# Patient Record
Sex: Female | Born: 1950
Health system: Southern US, Community
[De-identification: ages and names within clinical notes are randomized; demographics above are authoritative.]

## PROBLEM LIST (undated history)

## (undated) DIAGNOSIS — F319 Bipolar disorder, unspecified: Secondary | ICD-10-CM

## (undated) DIAGNOSIS — Z720 Tobacco use: Secondary | ICD-10-CM

## (undated) DIAGNOSIS — D649 Anemia, unspecified: Secondary | ICD-10-CM

## (undated) DIAGNOSIS — K5790 Diverticulosis of intestine, part unspecified, without perforation or abscess without bleeding: Secondary | ICD-10-CM

## (undated) DIAGNOSIS — I10 Essential (primary) hypertension: Secondary | ICD-10-CM

## (undated) HISTORY — DX: Bipolar disorder, unspecified: F31.9

## (undated) HISTORY — DX: Essential (primary) hypertension: I10

## (undated) HISTORY — DX: Diverticulosis of intestine, part unspecified, without perforation or abscess without bleeding: K57.90

## (undated) HISTORY — DX: Tobacco use: Z72.0

## (undated) HISTORY — DX: Anemia, unspecified: D64.9

---

## 1982-04-27 HISTORY — PX: TOTAL ABDOMINAL HYSTERECTOMY: SHX209

## 1997-07-26 ENCOUNTER — Encounter (HOSPITAL_COMMUNITY): Admission: RE | Admit: 1997-07-26 | Discharge: 1997-10-24 | Payer: Self-pay

## 2001-01-22 ENCOUNTER — Inpatient Hospital Stay (HOSPITAL_COMMUNITY): Admission: EM | Admit: 2001-01-22 | Discharge: 2001-01-31 | Payer: Self-pay | Admitting: *Deleted

## 2001-02-21 ENCOUNTER — Other Ambulatory Visit: Admission: RE | Admit: 2001-02-21 | Discharge: 2001-02-21 | Payer: Self-pay | Admitting: Obstetrics & Gynecology

## 2001-08-17 ENCOUNTER — Other Ambulatory Visit (HOSPITAL_COMMUNITY): Admission: RE | Admit: 2001-08-17 | Discharge: 2001-11-15 | Payer: Self-pay | Admitting: Psychiatry

## 2001-09-01 ENCOUNTER — Encounter: Admission: RE | Admit: 2001-09-01 | Discharge: 2001-09-01 | Payer: Self-pay | Admitting: *Deleted

## 2001-12-20 ENCOUNTER — Encounter: Admission: RE | Admit: 2001-12-20 | Discharge: 2001-12-20 | Payer: Self-pay | Admitting: *Deleted

## 2002-03-06 ENCOUNTER — Encounter: Admission: RE | Admit: 2002-03-06 | Discharge: 2002-03-06 | Payer: Self-pay | Admitting: *Deleted

## 2002-06-09 ENCOUNTER — Encounter: Admission: RE | Admit: 2002-06-09 | Discharge: 2002-06-09 | Payer: Self-pay | Admitting: *Deleted

## 2002-09-20 ENCOUNTER — Encounter: Admission: RE | Admit: 2002-09-20 | Discharge: 2002-09-20 | Payer: Self-pay | Admitting: *Deleted

## 2003-01-10 ENCOUNTER — Encounter: Admission: RE | Admit: 2003-01-10 | Discharge: 2003-01-10 | Payer: Self-pay | Admitting: *Deleted

## 2004-01-18 ENCOUNTER — Ambulatory Visit (HOSPITAL_COMMUNITY): Payer: Self-pay | Admitting: Psychiatry

## 2004-02-01 ENCOUNTER — Other Ambulatory Visit: Admission: RE | Admit: 2004-02-01 | Discharge: 2004-02-01 | Payer: Self-pay | Admitting: Obstetrics & Gynecology

## 2004-02-20 ENCOUNTER — Ambulatory Visit (HOSPITAL_COMMUNITY): Payer: Self-pay | Admitting: Psychiatry

## 2004-05-21 ENCOUNTER — Ambulatory Visit (HOSPITAL_COMMUNITY): Payer: Self-pay | Admitting: Psychiatry

## 2004-08-13 ENCOUNTER — Ambulatory Visit (HOSPITAL_COMMUNITY): Payer: Self-pay | Admitting: Psychiatry

## 2004-11-18 ENCOUNTER — Ambulatory Visit: Payer: Self-pay | Admitting: Gastroenterology

## 2004-11-26 ENCOUNTER — Encounter (INDEPENDENT_AMBULATORY_CARE_PROVIDER_SITE_OTHER): Payer: Self-pay | Admitting: *Deleted

## 2004-11-26 ENCOUNTER — Ambulatory Visit: Payer: Self-pay | Admitting: Gastroenterology

## 2004-12-01 ENCOUNTER — Ambulatory Visit (HOSPITAL_COMMUNITY): Payer: Self-pay | Admitting: Psychiatry

## 2004-12-30 ENCOUNTER — Ambulatory Visit: Payer: Self-pay | Admitting: Gastroenterology

## 2005-01-07 ENCOUNTER — Ambulatory Visit: Payer: Self-pay | Admitting: Gastroenterology

## 2005-01-29 ENCOUNTER — Ambulatory Visit: Payer: Self-pay | Admitting: Gastroenterology

## 2005-02-23 ENCOUNTER — Ambulatory Visit (HOSPITAL_COMMUNITY): Payer: Self-pay | Admitting: Psychiatry

## 2005-02-25 ENCOUNTER — Ambulatory Visit: Payer: Self-pay | Admitting: Gastroenterology

## 2005-03-06 ENCOUNTER — Ambulatory Visit: Payer: Self-pay | Admitting: Gastroenterology

## 2005-05-20 ENCOUNTER — Ambulatory Visit (HOSPITAL_COMMUNITY): Payer: Self-pay | Admitting: Psychiatry

## 2005-08-12 ENCOUNTER — Ambulatory Visit (HOSPITAL_COMMUNITY): Payer: Self-pay | Admitting: Psychiatry

## 2005-11-11 ENCOUNTER — Ambulatory Visit (HOSPITAL_COMMUNITY): Payer: Self-pay | Admitting: Psychiatry

## 2006-02-03 ENCOUNTER — Ambulatory Visit (HOSPITAL_COMMUNITY): Payer: Self-pay | Admitting: Psychiatry

## 2006-04-27 DIAGNOSIS — K5790 Diverticulosis of intestine, part unspecified, without perforation or abscess without bleeding: Secondary | ICD-10-CM

## 2006-04-27 HISTORY — DX: Diverticulosis of intestine, part unspecified, without perforation or abscess without bleeding: K57.90

## 2006-06-02 ENCOUNTER — Ambulatory Visit (HOSPITAL_COMMUNITY): Payer: Self-pay | Admitting: Psychiatry

## 2006-09-01 ENCOUNTER — Ambulatory Visit (HOSPITAL_COMMUNITY): Payer: Self-pay | Admitting: Psychiatry

## 2006-10-11 ENCOUNTER — Ambulatory Visit: Payer: Self-pay | Admitting: Hospitalist

## 2006-10-11 ENCOUNTER — Inpatient Hospital Stay (HOSPITAL_COMMUNITY): Admission: EM | Admit: 2006-10-11 | Discharge: 2006-10-20 | Payer: Self-pay | Admitting: Emergency Medicine

## 2006-10-12 ENCOUNTER — Encounter: Payer: Self-pay | Admitting: Internal Medicine

## 2006-10-12 ENCOUNTER — Encounter (INDEPENDENT_AMBULATORY_CARE_PROVIDER_SITE_OTHER): Payer: Self-pay | Admitting: Hospitalist

## 2006-10-14 DIAGNOSIS — K5731 Diverticulosis of large intestine without perforation or abscess with bleeding: Secondary | ICD-10-CM

## 2006-10-18 ENCOUNTER — Ambulatory Visit: Payer: Self-pay | Admitting: Internal Medicine

## 2006-10-27 ENCOUNTER — Ambulatory Visit: Payer: Self-pay | Admitting: Internal Medicine

## 2006-10-27 ENCOUNTER — Encounter (INDEPENDENT_AMBULATORY_CARE_PROVIDER_SITE_OTHER): Payer: Self-pay | Admitting: Internal Medicine

## 2006-10-27 DIAGNOSIS — E876 Hypokalemia: Secondary | ICD-10-CM | POA: Insufficient documentation

## 2006-10-27 DIAGNOSIS — D509 Iron deficiency anemia, unspecified: Secondary | ICD-10-CM | POA: Insufficient documentation

## 2006-10-27 DIAGNOSIS — M79609 Pain in unspecified limb: Secondary | ICD-10-CM

## 2006-10-27 DIAGNOSIS — Z8719 Personal history of other diseases of the digestive system: Secondary | ICD-10-CM

## 2006-10-27 DIAGNOSIS — F319 Bipolar disorder, unspecified: Secondary | ICD-10-CM

## 2006-10-27 DIAGNOSIS — I10 Essential (primary) hypertension: Secondary | ICD-10-CM | POA: Insufficient documentation

## 2006-10-27 DIAGNOSIS — Z9079 Acquired absence of other genital organ(s): Secondary | ICD-10-CM | POA: Insufficient documentation

## 2006-10-27 LAB — CONVERTED CEMR LAB
Basophils Absolute: 0 K/uL
Basophils Relative: 0 %
Eosinophils Absolute: 0 K/uL
Eosinophils Relative: 0 %
HCT: 35.7 % — ABNORMAL LOW
Hemoglobin: 11.8 g/dL — ABNORMAL LOW
Lymphocytes Relative: 10 % — ABNORMAL LOW
Lymphs Abs: 0.7 K/uL
MCHC: 33 g/dL
MCV: 86.5 fL
Monocytes Absolute: 0.7 K/uL
Monocytes Relative: 9 %
Neutro Abs: 5.8 K/uL
Neutrophils Relative %: 80 % — ABNORMAL HIGH
Platelets: 633 K/uL — ABNORMAL HIGH
RBC: 4.13 M/uL
RDW: 15 % — ABNORMAL HIGH
WBC: 7.2 10*3/microliter

## 2006-10-28 LAB — CONVERTED CEMR LAB
BUN: 6 mg/dL (ref 6–23)
CO2: 22 meq/L (ref 19–32)
Calcium: 10.1 mg/dL (ref 8.4–10.5)
Chloride: 106 meq/L (ref 96–112)
Glucose, Bld: 127 mg/dL — ABNORMAL HIGH (ref 70–99)
Sodium: 140 meq/L (ref 135–145)

## 2006-11-08 ENCOUNTER — Encounter (INDEPENDENT_AMBULATORY_CARE_PROVIDER_SITE_OTHER): Payer: Self-pay | Admitting: Internal Medicine

## 2006-11-11 ENCOUNTER — Ambulatory Visit: Payer: Self-pay | Admitting: Internal Medicine

## 2006-11-11 ENCOUNTER — Encounter (INDEPENDENT_AMBULATORY_CARE_PROVIDER_SITE_OTHER): Payer: Self-pay | Admitting: *Deleted

## 2006-11-11 LAB — CONVERTED CEMR LAB
Cholesterol: 179 mg/dL (ref 0–200)
Glucose, Bld: 95 mg/dL (ref 70–99)
HDL: 54 mg/dL (ref >39)
Hemoglobin: 11.4 g/dL — ABNORMAL LOW (ref 12.0–15.0)
MCV: 90.2 fL (ref 78.0–100.0)
Platelets: 325 10*3/uL (ref 150–400)
Sodium: 142 meq/L (ref 135–145)
VLDL: 13 mg/dL (ref 0–40)

## 2006-12-01 ENCOUNTER — Ambulatory Visit (HOSPITAL_COMMUNITY): Payer: Self-pay | Admitting: Psychiatry

## 2007-02-03 ENCOUNTER — Ambulatory Visit: Payer: Self-pay | Admitting: Internal Medicine

## 2007-02-03 ENCOUNTER — Encounter (INDEPENDENT_AMBULATORY_CARE_PROVIDER_SITE_OTHER): Payer: Self-pay | Admitting: *Deleted

## 2007-02-03 LAB — CONVERTED CEMR LAB
BUN: 11 mg/dL (ref 6–23)
CO2: 23 meq/L (ref 19–32)
Calcium: 9.2 mg/dL (ref 8.4–10.5)
Creatinine, Ser: 0.67 mg/dL (ref 0.40–1.20)
MCHC: 32.7 g/dL (ref 30.0–36.0)
Platelets: 320 10*3/uL (ref 150–400)
Potassium: 4.1 meq/L (ref 3.5–5.3)
RDW: 15.6 % — ABNORMAL HIGH (ref 11.5–14.0)
Sodium: 142 meq/L (ref 135–145)
WBC: 5.6 10*3/uL (ref 4.0–10.5)

## 2007-03-11 ENCOUNTER — Ambulatory Visit (HOSPITAL_COMMUNITY): Payer: Self-pay | Admitting: Psychiatry

## 2007-05-12 ENCOUNTER — Telehealth (INDEPENDENT_AMBULATORY_CARE_PROVIDER_SITE_OTHER): Payer: Self-pay | Admitting: *Deleted

## 2007-05-16 ENCOUNTER — Telehealth: Payer: Self-pay | Admitting: Internal Medicine

## 2007-06-06 ENCOUNTER — Encounter (INDEPENDENT_AMBULATORY_CARE_PROVIDER_SITE_OTHER): Payer: Self-pay | Admitting: *Deleted

## 2007-06-06 ENCOUNTER — Ambulatory Visit: Payer: Self-pay | Admitting: Hospitalist

## 2007-06-06 LAB — CONVERTED CEMR LAB
CO2: 24 meq/L (ref 19–32)
Calcium: 9.8 mg/dL (ref 8.4–10.5)
Chloride: 108 meq/L (ref 96–112)
MCV: 86.9 fL (ref 78.0–100.0)
Potassium: 4.2 meq/L (ref 3.5–5.3)
RBC: 4.65 M/uL (ref 3.87–5.11)
RDW: 15.9 % — ABNORMAL HIGH (ref 11.5–15.5)
Sodium: 141 meq/L (ref 135–145)

## 2007-06-08 ENCOUNTER — Ambulatory Visit (HOSPITAL_COMMUNITY): Payer: Self-pay | Admitting: Psychiatry

## 2007-07-27 ENCOUNTER — Encounter: Admission: RE | Admit: 2007-07-27 | Discharge: 2007-07-27 | Payer: Self-pay | Admitting: Urology

## 2007-07-29 ENCOUNTER — Encounter: Payer: Self-pay | Admitting: Urology

## 2007-07-29 ENCOUNTER — Ambulatory Visit (HOSPITAL_BASED_OUTPATIENT_CLINIC_OR_DEPARTMENT_OTHER): Admission: RE | Admit: 2007-07-29 | Discharge: 2007-07-29 | Payer: Self-pay | Admitting: Urology

## 2007-08-30 ENCOUNTER — Telehealth (INDEPENDENT_AMBULATORY_CARE_PROVIDER_SITE_OTHER): Payer: Self-pay | Admitting: *Deleted

## 2007-09-06 ENCOUNTER — Telehealth: Payer: Self-pay | Admitting: *Deleted

## 2007-09-07 ENCOUNTER — Ambulatory Visit (HOSPITAL_COMMUNITY): Payer: Self-pay | Admitting: Psychiatry

## 2007-10-27 ENCOUNTER — Ambulatory Visit: Payer: Self-pay | Admitting: *Deleted

## 2007-10-27 ENCOUNTER — Encounter (INDEPENDENT_AMBULATORY_CARE_PROVIDER_SITE_OTHER): Payer: Self-pay | Admitting: *Deleted

## 2007-10-27 LAB — CONVERTED CEMR LAB
Calcium: 9.9 mg/dL (ref 8.4–10.5)
Chloride: 105 meq/L (ref 96–112)
Creatinine, Ser: 0.65 mg/dL (ref 0.40–1.20)
HCT: 42.7 % (ref 36.0–46.0)
Platelets: 306 10*3/uL (ref 150–400)
Sodium: 139 meq/L (ref 135–145)
WBC: 5.8 10*3/uL (ref 4.0–10.5)

## 2007-10-31 ENCOUNTER — Ambulatory Visit (HOSPITAL_COMMUNITY): Payer: Self-pay | Admitting: Psychiatry

## 2008-02-01 ENCOUNTER — Ambulatory Visit (HOSPITAL_COMMUNITY): Payer: Self-pay | Admitting: Psychiatry

## 2008-02-28 ENCOUNTER — Ambulatory Visit: Payer: Self-pay | Admitting: Internal Medicine

## 2008-05-02 ENCOUNTER — Ambulatory Visit (HOSPITAL_COMMUNITY): Payer: Self-pay | Admitting: Psychiatry

## 2008-06-22 ENCOUNTER — Telehealth (INDEPENDENT_AMBULATORY_CARE_PROVIDER_SITE_OTHER): Payer: Self-pay | Admitting: *Deleted

## 2008-07-18 ENCOUNTER — Ambulatory Visit: Payer: Self-pay | Admitting: Internal Medicine

## 2008-07-18 ENCOUNTER — Encounter (INDEPENDENT_AMBULATORY_CARE_PROVIDER_SITE_OTHER): Payer: Self-pay | Admitting: Internal Medicine

## 2008-07-18 ENCOUNTER — Encounter (INDEPENDENT_AMBULATORY_CARE_PROVIDER_SITE_OTHER): Payer: Self-pay | Admitting: *Deleted

## 2008-07-18 LAB — CONVERTED CEMR LAB
BUN: 11 mg/dL (ref 6–23)
CO2: 23 meq/L (ref 19–32)
Calcium: 9.5 mg/dL (ref 8.4–10.5)
Chloride: 110 meq/L (ref 96–112)
GFR calc Af Amer: >60 mL/min (ref >60)
GFR calc non Af Amer: >60 mL/min (ref >60)
HCT: 39 % (ref 36.0–46.0)
Hemoglobin: 12.9 g/dL (ref 12.0–15.0)
MCHC: 33.1 g/dL (ref 30.0–36.0)
Platelets: 292 10*3/uL (ref 150–400)
Total Bilirubin: 0.4 mg/dL (ref 0.3–1.2)
Total Protein: 7.2 g/dL (ref 6.0–8.3)

## 2008-08-03 ENCOUNTER — Ambulatory Visit (HOSPITAL_COMMUNITY): Payer: Self-pay | Admitting: Psychiatry

## 2008-10-24 ENCOUNTER — Ambulatory Visit (HOSPITAL_COMMUNITY): Payer: Self-pay | Admitting: Psychiatry

## 2009-01-14 ENCOUNTER — Ambulatory Visit (HOSPITAL_COMMUNITY): Payer: Self-pay | Admitting: Psychiatry

## 2009-02-21 ENCOUNTER — Encounter (INDEPENDENT_AMBULATORY_CARE_PROVIDER_SITE_OTHER): Payer: Self-pay | Admitting: Internal Medicine

## 2009-02-21 ENCOUNTER — Encounter: Admission: RE | Admit: 2009-02-21 | Discharge: 2009-02-21 | Payer: Self-pay | Admitting: Obstetrics & Gynecology

## 2009-02-25 ENCOUNTER — Telehealth (INDEPENDENT_AMBULATORY_CARE_PROVIDER_SITE_OTHER): Payer: Self-pay | Admitting: *Deleted

## 2009-04-10 ENCOUNTER — Ambulatory Visit (HOSPITAL_COMMUNITY): Payer: Self-pay | Admitting: Psychiatry

## 2009-06-25 ENCOUNTER — Telehealth: Payer: Self-pay | Admitting: Internal Medicine

## 2009-07-03 ENCOUNTER — Ambulatory Visit (HOSPITAL_COMMUNITY): Payer: Self-pay | Admitting: Psychiatry

## 2009-07-22 ENCOUNTER — Ambulatory Visit: Payer: Self-pay | Admitting: Internal Medicine

## 2009-07-22 DIAGNOSIS — F17200 Nicotine dependence, unspecified, uncomplicated: Secondary | ICD-10-CM | POA: Insufficient documentation

## 2009-07-26 LAB — CONVERTED CEMR LAB
BUN: 11 mg/dL (ref 6–23)
Calcium: 10 mg/dL (ref 8.4–10.5)
Chloride: 103 meq/L (ref 96–112)
Ferritin: 80 ng/mL (ref 10–291)
Glucose, Bld: 90 mg/dL (ref 70–99)
MCV: 89.7 fL (ref >=78.0)
Sodium: 142 meq/L (ref 135–145)
WBC: 5.8 10*3/uL (ref 4.0–10.5)

## 2009-08-27 ENCOUNTER — Ambulatory Visit: Payer: Self-pay | Admitting: Infectious Disease

## 2009-08-28 ENCOUNTER — Ambulatory Visit (HOSPITAL_COMMUNITY): Payer: Self-pay | Admitting: Psychiatry

## 2009-08-28 LAB — CONVERTED CEMR LAB
Cholesterol: 182 mg/dL (ref 0–200)
HDL: 54 mg/dL (ref >39)

## 2009-09-25 ENCOUNTER — Encounter (INDEPENDENT_AMBULATORY_CARE_PROVIDER_SITE_OTHER): Payer: Self-pay | Admitting: Internal Medicine

## 2009-10-30 ENCOUNTER — Ambulatory Visit (HOSPITAL_COMMUNITY): Payer: Self-pay | Admitting: Psychiatry

## 2009-11-29 ENCOUNTER — Telehealth: Payer: Self-pay | Admitting: Internal Medicine

## 2010-01-07 ENCOUNTER — Ambulatory Visit: Payer: Self-pay | Admitting: Internal Medicine

## 2010-01-07 LAB — CONVERTED CEMR LAB
Calcium: 10.1 mg/dL (ref 8.4–10.5)
Creatinine, Ser: 0.76 mg/dL (ref 0.40–1.20)
Ferritin: 109 ng/mL (ref 10–291)
Glucose, Bld: 104 mg/dL — ABNORMAL HIGH (ref 70–99)
Hemoglobin: 12.8 g/dL (ref 12.0–15.0)
Iron: 35 ug/dL — ABNORMAL LOW (ref 42–145)
MCHC: 33.2 g/dL (ref 30.0–36.0)
Potassium: 3.7 meq/L (ref 3.5–5.3)
RDW: 13.4 % (ref 11.5–15.5)
Saturation Ratios: 11 % — ABNORMAL LOW (ref 20–55)
TIBC: 313 ug/dL (ref 250–470)

## 2010-01-29 ENCOUNTER — Ambulatory Visit (HOSPITAL_COMMUNITY): Payer: Self-pay | Admitting: Psychiatry

## 2010-05-18 ENCOUNTER — Encounter: Payer: Self-pay | Admitting: Obstetrics & Gynecology

## 2010-05-21 ENCOUNTER — Ambulatory Visit (HOSPITAL_COMMUNITY)
Admission: RE | Admit: 2010-05-21 | Discharge: 2010-05-21 | Payer: Self-pay | Source: Home / Self Care | Attending: Psychiatry | Admitting: Psychiatry

## 2010-05-29 NOTE — Letter (Signed)
[facility]   Imported By: Margie Billet 09/30/2009 14:55:26  _____________________________________________________________________  External Attachment:    Type:   Image     Comment:   External Document

## 2010-05-29 NOTE — Progress Notes (Signed)
Summary: Refill/gh  Phone Note Refill Request Message from:  Fax from Pharmacy on June 25, 2009 10:02 AM  Refills Requested: Medication #1:  FERROUS SULFATE 325 (65 FE) MG  TBEC Take 1 tablet by mouth two times a day   Last Refilled: 04/05/2009 Last office visit and labs was 07/18/2008.  Attempts to call pt to schedule an appointment.  No answer unable to leave a message to call for an appointment.   Method Requested: Electronic Initial call taken by: Angelina Ok RN,  June 25, 2009 10:04 AM  Follow-up for Phone Call        Rx denied because I cannot find an indication for medication (her diverticular bleed was in 2008 and she may therefore no longer require the iron replacement therapy).  In addition, Ms. Wurtz has not been seen in the clinic in over 1 year.  She will need to be seen in clinic with a CBC and iron profile/ferritin before a prescription will be written for to assure she still requires the iron therapy.  If she can not be reached we will wait for another attempt to contact us to set this up. Follow-up by: Doneen Poisson MD,  June 25, 2009 11:42 AM  Additional Follow-up for Phone Call Additional follow up Details #1::        Rx denial called/faxed to pharmacy with message to have pt call for an appointment. Additional Follow-up by: Angelina Ok RN,  June 26, 2009 12:23 PM

## 2010-05-29 NOTE — Assessment & Plan Note (Signed)
Summary: checkup [mkj]   Vital Signs:  Patient profile:   60 year old female Height:      63.25 inches Weight:      184.2 pounds BMI:     32.49 Temp:     97.9 degrees F oral Pulse rate:   96 / minute BP sitting:   144 / 92  (right arm)  Vitals Entered By: Filomena Jungling NT II (July 22, 2009 1:36 PM) CC: NEED REFILLS Is Patient Diabetic? No Pain Assessment Patient in pain? no      Nutritional Status BMI of > 30 = obese  Have you ever been in a relationship where you felt threatened, hurt or afraid?No   Does patient need assistance? Functional Status Self care Ambulation Normal   Primary Care Provider:  Rufina Falco MD  CC:  NEED REFILLS.  History of Present Illness: Pt is a 60 yo F with HTN, bipolar disorder, and h/o Fe def anemia here for follow-up.  She reports that she has been taking her medicines almost everyday but did not have them this weekend.  She needs new refills today.  She denies dizziness when on her meds.   She has a h/o anemia for which she has been taking iron for years.  SHe denies any bleeding.  She no longer has a menstrual cycle.  She denies hematochezia or melena.  She tolerates her Fe pills. She has been taking Risperdal for many years for her bipolar disorder. She denies depressed mood, manic symptoms, SI, HI.   She gets her risperdal from her psychiatrist.   She has chronic foot pain but the celebrex has been covering her. Her GERD is well controlled with protonix.  Preventive Screening-Counseling & Management  Alcohol-Tobacco     Smoking Status: current     Smoking Cessation Counseling: yes     Packs/Day: 1pck/wk     Year Started: 1975  Caffeine-Diet-Exercise     Does Patient Exercise: no  Mammogram  Procedure date:  02/21/2009  Findings:      Normal mammogram except benign calcifications (BIRADS-2), recommend follow-up in 1 yr  Problems Prior to Update: 1)  Tobacco Abuse  (ICD-305.1) 2)  Diverticulosis, Colon, With Hemorrhage   (ICD-562.12) 3)  Preventive Health Care  (ICD-V70.0) 4)  Hypokalemia  (ICD-276.8) 5)  Hysterectomy, Total, Hx of  (ICD-V45.77) 6)  Foot Pain, Chronic  (ICD-729.5) 7)  Bipolar Affective Disorder  (ICD-296.80) 8)  Hypertension  (ICD-401.9) 9)  Gastrointestinal Hemorrhage, Hx of  (ICD-V12.79) 10)  Anemia-iron Deficiency  (ICD-280.9)  Current Medications (verified): 1)  Risperdal 3 Mg  Tabs (Risperidone) .... Take 1 Tablet By Mouth At Bedtime 2)  Klor-Con M20 20 Meq  Tbcr (Potassium Chloride Crys Cr) .... Take 1 Tablet By Mouth Once A Day 3)  Protonix 40 Mg  Tbec (Pantoprazole Sodium) .... Take 1 Tablet By Mouth Once A Day 4)  Ferrous Sulfate 325 (65 Fe) Mg  Tbec (Ferrous Sulfate) .... Take 1 Tablet By Mouth Two Times A Day 5)  Amlodipine Besylate 10 Mg  Tabs (Amlodipine Besylate) .... Take 1 Tablet By Mouth Once A Day 6)  Metoprolol Tartrate 50 Mg  Tabs (Metoprolol Tartrate) .... Take 1 Tablet By Mouth Two Times A Day 7)  Celebrex 200 Mg  Caps (Celecoxib) .... Take 1 Tablet By Mouth Once A Day  Allergies (verified): No Known Drug Allergies  Past History:  Past Medical History: Last updated: 10/27/2007 Bi-polar disorder Chronic foot pain- now seeing Dr Charlsie Merles (podiatrist):  Anemia-iron deficiency Gastrointestinal hemorrhage, hx of Hypertension Tobacco abuse History of sigmoid diverticular bleed in June 2008 (S/P Colonoscopy 09/2006) Left foot arthritis:  sees a podiatrist.  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  She does not currently want to quit. Denies alcohol or drug use.  Review of Systems       The patient complains of peripheral edema.  The patient denies anorexia, fever, weight loss, vision loss, decreased hearing, chest pain, syncope, dyspnea on exertion,  prolonged cough, abdominal pain, melena, hematochezia, difficulty walking, depression, and abnormal bleeding.    Physical Exam  General:  alert, well-developed, and well-nourished.   Head:  normocephalic and atraumatic.   Eyes:  vision grossly intact, pupils equal, and pupils round.  1 mm pupils bilaterally  Mouth:  no gingival abnormalities and pharynx pink and moist.   Lungs:  normal respiratory effort, normal breath sounds, no crackles, and no wheezes.   Heart:  normal rate, regular rhythm, no murmur, no gallop, no rub, and no JVD.   Abdomen:  soft, non-tender, normal bowel sounds, no distention, no masses, no guarding, no rigidity, and no rebound tenderness.   Extremities:  no edema Neurologic:  alert & oriented X3, cranial nerves II-XII intact, strength normal in all extremities, sensation intact to light touch, and gait normal.   Psych:  Oriented X3, memory intact for recent and remote, and poor eye contact.     Impression & Recommendations:  Problem # 1:  HYPERTENSION (ICD-401.9) BP not at goal today but pt has not been taking her meds b/c she ran out.  I will check a B-met today to assess electrolytes and renal function.  I willhave her come back in one month when she has been taking her meds and see how her BP looks.  Her updated medication list for this problem includes:    Amlodipine Besylate 10 Mg Tabs (Amlodipine besylate) .Marland Kitchen... Take 1 tablet by mouth once a day    Metoprolol Tartrate 50 Mg Tabs (Metoprolol tartrate) .Marland Kitchen... Take 1 tablet by mouth two times a day  Orders: T-Basic Metabolic Panel ([id number])  Problem # 2:  ANEMIA-IRON DEFICIENCY (ICD-280.9) Pt denies any recent h/o blood loss that she is aware of.  I will recheck a CBC along with Fe studies to reassess her need for supplemental iron.  I will also request the report from her last colonoscopy and make sure she is up to date. Her updated medication list for this problem includes:    Ferrous Sulfate 325 (65 Fe)  Mg Tbec (Ferrous sulfate) .Marland Kitchen... Take 1 tablet by mouth two times a day  Orders: T-CBC No Diff ([id number]) T-Ferritin ([id number]) Augusto Gamble ([id number]) T-Iron Binding Capacity (TIBC) ([id number])  Problem # 3:  BIPOLAR AFFECTIVE DISORDER (ICD-296.80) Pt gets her risperdal from her psychiatrist.  She has been doing well regarding this problem.  Reports mood has been stable, tolerating med well, no HI/SI.  Problem # 4:  Preventive Health Care (ICD-V70.0) She reports that she is up to date on her colonoscopy and PAP (I am requesting reports from both).  She is up to date on her mammo which I entered into the system.   I will check an FLP at next visit. I am giving her her flu shot today.  Problem # 5:  TOBACCO ABUSE (ICD-305.1) Pt has smoked about 1 pack per week for 30 yrs.  She does not currently wish to quit.  I have advised her of the risks of smoking which she understands.  Complete Medication List: 1)  Risperdal 3 Mg Tabs (Risperidone) .... Take 1 tablet by mouth at bedtime 2)  Klor-con M20 20 Meq Tbcr (Potassium chloride crys cr) .... Take 1 tablet by mouth once a day 3)  Protonix 40 Mg Tbec (Pantoprazole sodium) .... Take 1 tablet by mouth once a day 4)  Ferrous Sulfate 325 (65 Fe) Mg Tbec (Ferrous sulfate) .... Take 1 tablet by mouth two times a day 5)  Amlodipine Besylate 10 Mg Tabs (Amlodipine besylate) .... Take 1 tablet by mouth once a day 6)  Metoprolol Tartrate 50 Mg Tabs (Metoprolol tartrate) .... Take 1 tablet by mouth two times a day 7)  Celebrex 200 Mg Caps (Celecoxib) .... Take 1 tablet by mouth once a day  Other Orders: Admin 1st Vaccine ([address]) Flu Vaccine 78yrs + ([address])  Patient Instructions: 1)  Please schedule a follow-up appointment in 1 month and come in fasting. 2)  You will have somes labs checked today. I will call you if there is anything in the results that needs to be addressed.  3)  Continue taking all of your other medicines as you have been.  4)   Tobacco is very bad for your health and your loved ones! You Should stop smoking!. 5)  Stop Smoking Tips: Choose a Quit date. Cut down before the Quit date. decide what you will do as a substitute when you feel the urge to smoke(gum,toothpick,exercise). 6)  Great to meet you, call if you have any problems before next visit. Prescriptions: METOPROLOL TARTRATE 50 MG  TABS (METOPROLOL TARTRATE) Take 1 tablet by mouth two times a day  #60 Tablet x 5   Entered and Authorized by:   Brooks Sailors MD   Signed by:   Brooks Sailors MD on 07/22/2009   Method used:   Electronically to        [facility]  [address] [id number] ([facility]ail)       [address]       [place], [place]  [address]       Ph: [phone] or [phone]       Fax: [phone]   RxID:   [id number] AMLODIPINE BESYLATE 10 MG  TABS (AMLODIPINE BESYLATE) Take 1 tablet by mouth once a day  #30 Tablet x 5   Entered and Authorized by:   Brooks Sailors MD   Signed by:   Brooks Sailors MD on 07/22/2009   Method used:   [facility] to        [facility]  [address] [id number]       [address]       [place], [place]  [address]       Ph: [phone] or [phone]       Fax: [phone]   RxID:   [id number]   Prevention & Chronic Care Immunizations   Influenza vaccine: Fluvax 3+  (07/22/2009)    Tetanus booster: Not documented    Pneumococcal vaccine: Not documented  Colorectal Screening   Hemoccult: Not documented    Colonoscopy: Not documented  Other Screening   Pap smear: Not documented    Mammogram: Normal mammogram except benign calcifications (BIRADS-2), recommend follow-up in 1 yr  (02/21/2009)  Reports  requested:   Last colonoscopy report requested.   Last Pap report requested.  Smoking status: current  (07/22/2009)   Smoking cessation counseling: yes  (07/22/2009)  Lipids   Total Cholesterol: 179  (11/11/2006)   LDL: 112  (11/11/2006)   LDL Direct: Not documented   HDL:  54  (11/11/2006)   Triglycerides: 66  (11/11/2006)  Hypertension   Last Blood Pressure: 144 / 92  (07/22/2009)   Serum creatinine: 0.70  (07/18/2008)   BMP action: Ordered   Serum potassium 4.0  (07/18/2008)    Hypertension flowsheet reviewed?: Yes   Progress toward BP goal: Deteriorated  Self-Management Support :   Personal Goals (by the next clinic visit) :      Personal blood pressure goal: 130/80  (07/22/2009)   Patient will work on the following items until the next clinic visit to reach self-care goals:     Medications and monitoring: take my medicines every day, bring all of my medications to every visit  (07/22/2009)     Eating: eat more vegetables, eat foods that are low in salt, eat baked foods instead of fried foods  (07/22/2009)     Activity: take the stairs instead of the elevator  (07/22/2009)    Hypertension self-management support: Education handout, Written self-care plan  (07/22/2009)   Hypertension self-care plan printed.   Hypertension education handout printed   Nursing Instructions: Give Flu vaccine today Request report of last colonoscopy Request report of last Pap    Process Orders Check Orders Results:     [facility]: ABN not required for this insurance Tests Sent for requisitioning (July 26, 2009 6:56 PM):     07/22/2009: [facility] -- T-Basic Metabolic Panel [id number] (signed)     07/22/2009: [facility] -- T-CBC No Diff [[id number]] (signed)     07/22/2009: [facility] -- T-Ferritin [[id number]] (signed)     07/22/2009: [facility] -- Augusto Gamble [[id number]] (signed)     07/22/2009: [facility] -- T-Iron Binding Capacity (TIBC) [[id number]] (signed)    Primary Care Provider:  Rufina Falco MD  CC:  NEED REFILLS.  History of Present Illness: Pt is a 60 yo F with HTN, bipolar disorder, and h/o Fe def anemia here for follow-up.  She reports  that she has been taking her medicines almost everyday but did not have them this weekend.  She needs new refills today.  She denies dizziness when on her meds.   She has a h/o anemia for which she has been taking iron for years.  SHe denies any bleeding.  She no longer has a menstrual cycle.  She denies hematochezia or melena.  She tolerates her Fe pills. She has been taking Risperdal for many years for her bipolar disorder. She denies depressed mood, manic symptoms, SI, HI.   She gets her risperdal from her psychiatrist.   She has chronic foot pain but the celebrex has been covering her. Her GERD is well controlled with protonix.   Prevention & Chronic Care Immunizations   Influenza vaccine: Fluvax 3+  (07/22/2009)    Tetanus booster: Not documented    Pneumococcal vaccine: Not documented  Colorectal Screening   Hemoccult: Not documented    Colonoscopy: Not documented  Other Screening   Pap smear: Not documented    Mammogram: Normal mammogram except benign calcifications (BIRADS-2), recommend follow-up in 1 yr  (02/21/2009)  Reports requested:   Last colonoscopy report requested.   Last Pap report requested.  Smoking  status: current  (07/22/2009)   Smoking cessation counseling: yes  (07/22/2009)  Lipids   Total Cholesterol: 179  (11/11/2006)   LDL: 112  (11/11/2006)   LDL Direct: Not documented   HDL: 54  (11/11/2006)   Triglycerides: 66  (11/11/2006)  Hypertension   Last Blood Pressure: 144 / 92  (07/22/2009)   Serum creatinine: 0.70  (07/18/2008)   BMP action: Ordered   Serum potassium 4.0  (07/18/2008)    Hypertension flowsheet reviewed?: Yes   Progress toward BP goal: Deteriorated  Self-Management Support :   Personal Goals (by the next clinic visit) :      Personal blood pressure goal: 130/80  (07/22/2009)   Patient will work on the following items until the next clinic visit to reach self-care goals:     Medications and monitoring: take my medicines every  day, bring all of my medications to every visit  (07/22/2009)     Eating: eat more vegetables, eat foods that are low in salt, eat baked foods instead of fried foods  (07/22/2009)     Activity: take the stairs instead of the elevator  (07/22/2009)    Hypertension self-management support: Education handout, Written self-care plan  (07/22/2009)   Hypertension self-care plan printed.   Hypertension education handout printed   Nursing Instructions: Give Flu vaccine today Request report of last colonoscopy Request report of last Pap  Flu Vaccine Consent Questions     Do you have a history of severe allergic reactions to this vaccine? no    Any prior history of allergic reactions to egg and/or gelatin? no    Do you have a sensitivity to the preservative Thimersol? no    Do you have a past history of Guillan-Barre Syndrome? no    Do you currently have an acute febrile illness? no    Have you ever had a severe reaction to latex? no    Vaccine information given and explained to patient? yes    Are you currently pregnant? no    Lot [id number]   Exp Date:07/2009   Manufacturer: [employer]    Site Given  Right Deltoid [employer]

## 2010-05-29 NOTE — Assessment & Plan Note (Signed)
Summary: EST-CK/FU/MEDS/CFB   Vital Signs:  Patient profile:   60 year old female Height:      63.25 inches Weight:      192.8 pounds BMI:     34.01 Temp:     97.2 degrees F oral Pulse rate:   87 / minute BP sitting:   124 / 81  (right arm)  Vitals Entered By: Filomena Jungling NT II (January 07, 2010 10:49 AM) CC: NEED REFILLS Is Patient Diabetic? No Nutritional Status BMI of > 30 = obese  Have you ever been in a relationship where you felt threatened, hurt or afraid?No   Does patient need assistance? Functional Status Self care Ambulation Normal   Primary Care Provider:  Rufina Falco MD  CC:  NEED REFILLS.  History of Present Illness: 60 year old with Past Medical History: Bi-polar disorder Chronic foot pain- now seeing Dr Charlsie Merles (podiatrist):   Anemia-iron deficiency Hypertension Tobacco abuse History of sigmoid diverticular bleed in June 2008 (S/P Colonoscopy 09/2006)  She present to follow up her BP. She has been taking her medications. She denies adversed effect from medications. She doesnt have any new complaints.   Here for med refills.    Preventive Screening-Counseling & Management  Alcohol-Tobacco     Smoking Status: current     Smoking Cessation Counseling: yes     Packs/Day: 1pck/wk     Year Started: 1975  Caffeine-Diet-Exercise     Does Patient Exercise: no  Current Medications (verified): 1)  Risperdal 3 Mg  Tabs (Risperidone) .... Take 1 Tablet By Mouth At Bedtime 2)  Protonix 40 Mg  Tbec (Pantoprazole Sodium) .... Take 1 Tablet By Mouth Once A Day 3)  Amlodipine Besylate 10 Mg  Tabs (Amlodipine Besylate) .... Take 1 Tablet By Mouth Once A Day 4)  Metoprolol Tartrate 50 Mg  Tabs (Metoprolol Tartrate) .... Take 1 Tablet By Mouth Two Times A Day 5)  Celebrex 200 Mg  Caps (Celecoxib) .... Take 1 Tablet By Mouth Once A Day  Allergies (verified): No Known Drug Allergies  Past History:  Past Medical History: Last updated: 10/27/2007 Bi-polar  disorder Chronic foot pain- now seeing Dr Charlsie Merles (podiatrist):   Anemia-iron deficiency Gastrointestinal hemorrhage, hx of Hypertension Tobacco abuse History of sigmoid diverticular bleed in June 2008 (S/P Colonoscopy 09/2006) Left foot arthritis:  sees a podiatrist.  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  She does not currently want to quit. Denies alcohol or drug use.  Risk Factors: Exercise: no (01/07/2010)  Risk Factors: Smoking Status: current (01/07/2010) Packs/Day: 1pck/wk (01/07/2010)  Review of Systems      See HPI  Physical Exam  General:  alert and well-developed.   Head:  normocephalic and atraumatic.   Eyes:  vision grossly intact, pupils equal, pupils round, and pupils reactive to light.   Mouth:  good dentition.   Neck:  supple.   Lungs:  normal respiratory effort and normal breath sounds.   Heart:  normal rate and regular rhythm.   Abdomen:  soft and non-tender.   Msk:  normal ROM.   Extremities:  trace lower extremity edema  Neurologic:  alert & oriented X3.   Psych:  Oriented X3, good eye contact, not anxious appearing, and not depressed appearing.  Impression & Recommendations:  Problem # 1:  HYPERTENSION (ICD-401.9) Assessment Unchanged Blood pressure at goal, continue with current regimen.   Her updated medication list for this problem includes:    Amlodipine Besylate 10 Mg Tabs (Amlodipine besylate) .Marland Kitchen... Take 1 tablet by mouth once a day    Metoprolol Tartrate 50 Mg Tabs (Metoprolol tartrate) .Marland Kitchen... Take 1 tablet by mouth two times a day  Orders: T-Basic Metabolic Panel [phone])  BP today: 124/81 Prior BP: 125/84 (08/27/2009)  Labs Reviewed: K+: 3.8 (07/22/2009) Creat: : 0.84 (07/22/2009)    Chol: 182 (08/27/2009)   HDL: 54 (08/27/2009)   LDL: 108 (08/27/2009)   TG: 98 (08/27/2009)  Problem # 2:  PREVENTIVE HEALTH CARE (ICD-V70.0) Obtain colonoscopy report.   Problem # 3:  BIPOLAR AFFECTIVE DISORDER (ICD-296.80) Assessment: Comment Only Managed on risperdone by [facility]. Currently stable.   Problem # 4:  ANEMIA-IRON DEFICIENCY (ICD-280.9) Assessment: Comment Only Pt no longer on iron, as her last anemia panel and Hgb were within normal limits. Will check anemia panel and CBC today.   The following medications were removed from the medication list:    Ferrous Sulfate 325 (65 Fe) Mg Tbec (Ferrous sulfate) .Marland Kitchen... Take 1 tablet by mouth two times a day  Hgb: 14.9 (07/22/2009)   Hct: 44.4 (07/22/2009)   Platelets: 317 (07/22/2009) RBC: 4.95 (07/22/2009)   RDW: 13.5 (07/22/2009)   WBC: 5.8 (07/22/2009) MCV: 89.7 (07/22/2009)   MCHC: 33.6 (07/22/2009) Ferritin: 80 (07/22/2009) Iron: 79 (07/22/2009)   TIBC: 293 (07/22/2009)   % Sat: 27 (07/22/2009)  Orders: T-CBC No Diff ([id number]) T-Ferritin ([id number]) T-Iron ([id number]) T-Iron Binding Capacity (TIBC) ([id number])  Complete Medication List: 1)  Risperdal 3 Mg Tabs (Risperidone) .... Take 1 tablet by mouth at bedtime 2)  Protonix 40 Mg Tbec (Pantoprazole sodium) .... Take 1 tablet by mouth once a day 3)  Amlodipine Besylate 10 Mg Tabs (Amlodipine besylate) .... Take 1 tablet by mouth once a day 4)  Metoprolol Tartrate 50 Mg Tabs (Metoprolol tartrate) .... Take 1 tablet by mouth two times a day 5)  Celebrex 200 Mg Caps (Celecoxib) .... Take 1 tablet by mouth once a day  Patient Instructions: 1)  Please schedule a follow-up appointment in 6 months. 2)  Tobacco is very bad for your health and your loved ones! You Should stop smoking!. 3)  Stop Smoking Tips: Choose a Quit date. Cut down before the Quit date. decide what you will do as a substitute when you feel the urge to  smoke(gum,toothpick,exercise). Prescriptions: METOPROLOL TARTRATE 50 MG  TABS (METOPROLOL TARTRATE) Take 1 tablet by mouth two times a day  #60 Tablet x 6   Entered and Authorized by:   Melida Quitter MD   Signed by:   Melida Quitter MD on 01/07/2010   Method used:   Print then Give to Patient   RxID:   [id number] AMLODIPINE BESYLATE 10 MG  TABS (AMLODIPINE BESYLATE) Take 1 tablet by mouth once a day  #30 Tablet x 6   Entered and Authorized by:   Melida Quitter MD   Signed by:   Melida Quitter MD on 01/07/2010   Method used:   Print then Give to Patient   RxID:   [id number] PROTONIX 40 MG  TBEC (PANTOPRAZOLE SODIUM) Take 1 tablet by mouth once a day  #30 Tablet x 6   Entered and Authorized by:   Melida Quitter MD   Signed by:   Melida Quitter MD on 01/07/2010   Method used:  Print then Give to Patient   RxID:   [id number]   Prevention & Chronic Care Immunizations   Influenza vaccine: Fluvax 3+  (07/22/2009)    Tetanus booster: Not documented    Pneumococcal vaccine: Not documented  Colorectal Screening   Hemoccult: Not documented    Colonoscopy: Not documented   Colonoscopy due: 12/26/2017  Other Screening   Pap smear: Not documented   Pap smear action/deferral: Not indicated S/P hysterectomy  (01/07/2010)    Mammogram: Normal mammogram except benign calcifications (BIRADS-2), recommend follow-up in 1 yr  (02/21/2009)  Reports requested:   Last colonoscopy report requested.  Smoking status: current  (01/07/2010)   Smoking cessation counseling: yes  (01/07/2010)  Lipids   Total Cholesterol: 182  (08/27/2009)   LDL: 108  (08/27/2009)   LDL Direct: Not documented   HDL: 54  (08/27/2009)   Triglycerides: 98  (08/27/2009)  Hypertension   Last Blood Pressure: 124 / 81  (01/07/2010)   Serum creatinine: 0.84  (07/22/2009)   BMP action: Ordered   Serum potassium 3.8  (07/22/2009)    Hypertension flowsheet reviewed?: Yes   Progress toward BP goal: At  goal  Self-Management Support :   Personal Goals (by the next clinic visit) :      Personal blood pressure goal: 130/80  (01/07/2010)   Patient will work on the following items until the next clinic visit to reach self-care goals:     Medications and monitoring: take my medicines every day, bring all of my medications to every visit  (01/07/2010)     Eating: drink diet soda or water instead of juice or soda, eat more vegetables, use fresh or frozen vegetables, eat foods that are low in salt, eat baked foods instead of fried foods, eat fruit for snacks and desserts  (01/07/2010)     Activity: take a 30 minute walk every day  (01/07/2010)    Hypertension self-management support: Education handout, Resources for patients handout, Written self-care plan  (01/07/2010)   Hypertension self-care plan printed.   Hypertension education handout printed      Resource handout printed.   Nursing Instructions: Request report of last colonoscopy    Process Orders Check Orders Results:     [facility]: ABN not required for this insurance Tests Sent for requisitioning (January 07, 2010 11:30 AM):     01/07/2010: [facility] -- T-Basic Metabolic Panel [phone] (signed)     01/07/2010: [facility] -- T-CBC No Diff [[id number]] (signed)     01/07/2010: [facility] -- T-Ferritin [[id number]] (signed)     01/07/2010: [facility] -- Augusto Gamble [[id number]] (signed)     01/07/2010: [facility] -- T-Iron Binding Capacity (TIBC) [[id number]] (signed)

## 2010-05-29 NOTE — Progress Notes (Signed)
Summary: Refill/gh  Phone Note Refill Request Message from:  Fax from Pharmacy on November 29, 2009 2:52 PM  Refills Requested: Medication #1:  PROTONIX 40 MG  TBEC Take 1 tablet by mouth once a day   Last Refilled: 10/29/2009  Method Requested: Electronic Initial call taken by: Angelina Ok RN,  November 29, 2009 2:52 PM  Follow-up for Phone Call        Refill approved-nurse to complete Follow-up by: Melida Quitter MD,  December 01, 2009 12:46 PM    Prescriptions: PROTONIX 40 MG  TBEC (PANTOPRAZOLE SODIUM) Take 1 tablet by mouth once a day  #30 Tablet x 6   Entered and Authorized by:   Melida Quitter MD   Signed by:   Melida Quitter MD on 12/01/2009   Method used:   Electronically to        [facility]  [address] [id number] (retail)       [address]       [place], [place]  [address]       Ph: [phone] or [phone]       Fax: [phone]   RxID:   [id number]

## 2010-05-29 NOTE — Assessment & Plan Note (Signed)
Summary: acute- 1 month f/u with bp and fastin labs per dr sitko/cfb   Vital Signs:  Patient profile:   60 year old female Height:      63.25 inches (160.66 cm) Weight:      187.6 pounds (85.27 kg) BMI:     33.09 Temp:     97.2 degrees F (36.22 degrees C) oral Pulse rate:   78 / minute BP sitting:   125 / 84  (right arm)  Vitals Entered By: Stanton Kidney Ditzler RN (Aug 27, 2009 9:56 AM) Is Patient Diabetic? No Pain Assessment Patient in pain? no      Nutritional Status BMI of > 30 = obese Nutritional Status Detail appetite good  Have you ever been in a relationship where you felt threatened, hurt or afraid?denies   Does patient need assistance? Functional Status Self care Ambulation Normal Comments FU and fasting for labs. Needs refills on med - ins co wants meds airmail.   Primary Care Provider:  Rufina Falco MD   History of Present Illness: 60 year old with Past Medical History: Bi-polar disorder Chronic foot pain- now seeing Dr Charlsie Merles (podiatrist):   Anemia-iron deficiency Gastrointestinal hemorrhage, hx of Hypertension Tobacco abuse History of sigmoid diverticular bleed in June 2008 (S/P Colonoscopy 09/2006) Left foot arthritis:  sees a podiatrist.  On celebrex  She present to follow up her BP. She has been taking her medications. She denies adversed effect from medications. She doesnt have any new complaints.  She said that her insurance company wont cover for her medication unless she get her medications by mail.  Depression History:      The patient denies a depressed mood most of the day and a diminished interest in her usual daily activities.         Preventive Screening-Counseling & Management  Alcohol-Tobacco     Smoking Status: current     Smoking Cessation Counseling: yes     Packs/Day: 1pck/wk     Year Started: 1975  Caffeine-Diet-Exercise     Does Patient Exercise: no  Current Medications (verified): 1)  Risperdal 3 Mg  Tabs (Risperidone) .... Take  1 Tablet By Mouth At Bedtime 2)  Klor-Con M20 20 Meq  Tbcr (Potassium Chloride Crys Cr) .... Take 1 Tablet By Mouth Once A Day 3)  Protonix 40 Mg  Tbec (Pantoprazole Sodium) .... Take 1 Tablet By Mouth Once A Day 4)  Ferrous Sulfate 325 (65 Fe) Mg  Tbec (Ferrous Sulfate) .... Take 1 Tablet By Mouth Two Times A Day 5)  Amlodipine Besylate 10 Mg  Tabs (Amlodipine Besylate) .... Take 1 Tablet By Mouth Once A Day 6)  Metoprolol Tartrate 50 Mg  Tabs (Metoprolol Tartrate) .... Take 1 Tablet By Mouth Two Times A Day 7)  Celebrex 200 Mg  Caps (Celecoxib) .... Take 1 Tablet By Mouth Once A Day  Allergies: No Known Drug Allergies  Review of Systems  The patient denies fever, chest pain, syncope, dyspnea on exertion, peripheral edema, prolonged cough, headaches, hemoptysis, abdominal pain, and melena.    Physical Exam  General:  alert, well-developed, and well-nourished.   Head:  normocephalic, atraumatic, and no abnormalities observed.   Lungs:  normal respiratory effort, no intercostal retractions, no accessory muscle use, and normal breath sounds.   Heart:  normal rate and regular rhythm.   Abdomen:  soft, non-tender, normal bowel sounds, and no distention.   Extremities:  No edema.  Neurologic:  alert & oriented X3.  Impression & Recommendations:  Problem # 1:  HYPERTENSION (ICD-401.9) Her blood pressure is well controlled. I will continue with current regimen. I refer her to MAP program to help her with the process of getting medications trhough mail. I advised patient to let us know if the MAPprogrem cant help her.  Her updated medication list for this problem includes:    Amlodipine Besylate 10 Mg Tabs (Amlodipine besylate) .Marland Kitchen... Take 1 tablet by mouth once a day    Metoprolol Tartrate 50 Mg Tabs (Metoprolol tartrate) .Marland Kitchen... Take 1 tablet by mouth two times a day  BP today: 125/84 Prior BP: 144/92 (07/22/2009)  Labs Reviewed: K+: 3.8 (07/22/2009) Creat: : 0.84 (07/22/2009)    Chol: 179 (11/11/2006)   HDL: 54 (11/11/2006)   LDL: 112 (11/11/2006)   TG: 66 (11/11/2006)  Problem # 2:  ANEMIA-IRON DEFICIENCY (ICD-280.9) As per Dr Theotis Barrio note, I advised patient to finish iron tablet left.  Her updated medication list for this problem includes:    Ferrous Sulfate 325 (65 Fe) Mg Tbec (Ferrous sulfate) .Marland Kitchen... Take 1 tablet by mouth two times a day  Problem # 3:  SCREENING FOR LIPOID DISORDERS (ICD-V77.91) I will check fasting lipid profile. I will treat as needed.  Orders: T-Lipid Profile ([id number])  Problem # 4:  TOBACCO ABUSE (ICD-305.1) Counseling was provied.   Complete Medication List: 1)  Risperdal 3 Mg Tabs (Risperidone) .... Take 1 tablet by mouth at bedtime 2)  Klor-con M20 20 Meq Tbcr (Potassium chloride crys cr) .... Take 1 tablet by mouth once a day 3)  Protonix 40 Mg Tbec (Pantoprazole sodium) .... Take 1 tablet by mouth once a day 4)  Ferrous Sulfate 325 (65 Fe) Mg Tbec (Ferrous sulfate) .... Take 1 tablet by mouth two times a day 5)  Amlodipine Besylate 10 Mg Tabs (Amlodipine besylate) .... Take 1 tablet by mouth once a day 6)  Metoprolol Tartrate 50 Mg Tabs (Metoprolol tartrate) .... Take 1 tablet by mouth two times a day 7)  Celebrex 200 Mg Caps (Celecoxib) .... Take 1 tablet by mouth once a day  Patient Instructions: 1)  Please schedule a follow-up appointment in 2 months.  Prevention & Chronic Care Immunizations   Influenza vaccine: Fluvax 3+  (07/22/2009)    Tetanus booster: Not documented    Pneumococcal vaccine: Not documented  Colorectal Screening   Hemoccult: Not documented    Colonoscopy: Not documented  Other Screening   Pap smear: Not documented    Mammogram: Normal mammogram except benign calcifications (BIRADS-2), recommend follow-up in 1 yr  (02/21/2009)   Smoking status: current  (08/27/2009)   Smoking cessation counseling: yes  (08/27/2009)  Lipids   Total Cholesterol: 179  (11/11/2006)   LDL: 112  (11/11/2006)    LDL Direct: Not documented   HDL: 54  (11/11/2006)   Triglycerides: 66  (11/11/2006)  Hypertension   Last Blood Pressure: 125 / 84  (08/27/2009)   Serum creatinine: 0.84  (07/22/2009)   BMP action: Ordered   Serum potassium 3.8  (07/22/2009)  Self-Management Support :   Personal Goals (by the next clinic visit) :      Personal blood pressure goal: 130/80  (07/22/2009)   Patient will work on the following items until the next clinic visit to reach self-care goals:     Medications and monitoring: take my medicines every day, bring all of my medications to every visit  (08/27/2009)     Eating: eat more vegetables, use fresh or frozen vegetables, eat  foods that are low in salt, eat baked foods instead of fried foods, eat fruit for snacks and desserts, limit or avoid alcohol  (08/27/2009)     Activity: take a 30 minute walk every day  (08/27/2009)    Hypertension self-management support: Written self-care plan, Education handout, Resources for patients handout  (08/27/2009)   Hypertension self-care plan printed.   Hypertension education handout printed      Resource handout printed.  Process Orders Check Orders Results:     [facility]: ABN not required for this insurance Tests Sent for requisitioning (Aug 27, 2009 1:47 PM):     08/27/2009: [facility] -- T-Lipid Profile [phone] (signed)    Process Orders Check Orders Results:     [facility]: ABN not required for this insurance Tests Sent for requisitioning (Aug 27, 2009 1:47 PM):     08/27/2009: [facility] -- T-Lipid Profile [phone] (signed)

## 2010-06-26 ENCOUNTER — Other Ambulatory Visit: Payer: Self-pay | Admitting: *Deleted

## 2010-06-27 MED ORDER — AMLODIPINE BESYLATE 10 MG PO TABS: 10.0000 mg | ORAL_TABLET | Freq: Every day | ORAL | Status: DC

## 2010-06-27 MED ORDER — PANTOPRAZOLE SODIUM 40 MG PO TBEC: 40.0000 mg | DELAYED_RELEASE_TABLET | Freq: Every day | ORAL | Status: DC

## 2010-07-02 ENCOUNTER — Telehealth: Payer: Self-pay | Admitting: *Deleted

## 2010-07-02 NOTE — Telephone Encounter (Signed)
Call from [facility] pt would like to get Metoprolol Tartrate 50 mg tablets # 90 with 1 years refill sent to [facility] .

## 2010-07-03 ENCOUNTER — Other Ambulatory Visit: Payer: Self-pay | Admitting: *Deleted

## 2010-07-03 MED ORDER — METOPROLOL TARTRATE 50 MG PO TABS: 50.0000 mg | ORAL_TABLET | Freq: Two times a day (BID) | ORAL | Status: DC

## 2010-07-04 NOTE — Telephone Encounter (Signed)
Refill called to [facility] per order of Dr. Baltazar Apo.

## 2010-07-24 ENCOUNTER — Other Ambulatory Visit: Payer: Self-pay | Admitting: Internal Medicine

## 2010-07-30 ENCOUNTER — Encounter (HOSPITAL_COMMUNITY): Payer: BC Managed Care – PPO | Admitting: Psychiatry

## 2010-07-30 DIAGNOSIS — F3189 Other bipolar disorder: Secondary | ICD-10-CM

## 2010-09-09 NOTE — Op Note (Signed)
[name], Pamela Hardin             ACCOUNT NO.:  [id number]   MEDICAL RECORD NO.:  [id number]          PATIENT TYPE:  AMB   LOCATION:  [facility]                         FACILITY:  [facility]   PHYSICIAN:  Bertram Millard. Dahlstedt, M.D.DATE OF BIRTH:  November 29, 1950   DATE OF PROCEDURE:  07/29/2007  DATE OF DISCHARGE:                               OPERATIVE REPORT   PREOPERATIVE DIAGNOSIS:  Bladder lesion.   POSTOPERATIVE DIAGNOSIS:  Bladder lesion with abnormal ureteral  orifices.   PRINCIPAL PROCEDURE:  Cystoscopy, bladder biopsy.   SURGEON:  Bertram Millard. Dahlstedt, M.D.   ANESTHESIA:  General with LMA.   COMPLICATIONS:  None.   BRIEF HISTORY:  This lady was referred quite a few months ago for  microscopic hematuria.  This has been found in the office.  CT revealed  essentially normal findings.  Cystoscopy revealed a lesion on the right  side of the bladder.  It was recommended she undergo cysto with biopsy.  I explained the risks and complications of the procedure.  The patient  appears to understand these and desires to proceed.   DESCRIPTION OF PROCEDURE:  The patient was identified in the holding  area and received preoperative IV antibiotics.  She was taken to the  operating room where general anesthetic was administered using the LMA.  She was placed in the dorsal lithotomy position and genitalia and  perineum were prepped and draped.  A time-out was then called.  Proper  patient, proper procedure, proper positioning, proper position,  antibiotics were discussed.  The procedure then commenced.  Cystoscope  was placed in her bladder.  There was a 5-6 mm erythematous lesion on  the right bladder wall.  I saw no other bladder lesions/tumors.  No  bladder calculi were noted.  She did have quite patulous ureteral  orifices with extremely lateral position of these - they were quite  golf hole in appearance.  I saw no lesions around the ureteral  orifice.  After the biopsy was performed of this  bladder wall lesion the  biopsy base was then cauterized with a Bugbee electrode.  The bladder  was drained.  The specimen was sent as bladder biopsy.  She tolerated the procedure well.  She was awakened and taken to the  PACU in stable condition.  She will follow up with me on April 14 of  this month.  She was discharged on urell #15 one p.o. q.6 h. p.r.n.  bladder pain and Bactrim DS one p.o. b.i.d. for 3 days.      Bertram Millard. Dahlstedt, M.D.  Electronically Signed     SMD/MEDQ  D:  07/29/2007  T:  07/29/2007  Job:  [id number]   cc:   Gerrit Friends. Aldona Bar, M.D.  Fax: [phone]

## 2010-09-09 NOTE — Discharge Summary (Signed)
NAMERAYLEN, KEN NO.:  [id number]   MEDICAL RECORD NO.:  [id number]          PATIENT TYPE:  INP   LOCATION:  5154                         FACILITY:  [facility]   PHYSICIAN:  Eliseo Gum, M.D.   DATE OF BIRTH:  1951-03-31   DATE OF ADMISSION:  10/11/2006  DATE OF DISCHARGE:  10/20/2006                               DISCHARGE SUMMARY   DISCHARGE DIAGNOSES:  1. Anemia likely secondary to acute diverticular bleed, most notably      in the sigmoid colon.  2. Orthostatic hypotension.  3. Hypertension.  4. Bipolar disorder.   DISCHARGE MEDICATIONS:  1. Risperdal 3 mg p.o. q.h.s.  2. Klor-Con 20 mEq p.o. daily.  3. Protonix 40 mg p.o. daily.  4. Ferrous sulfate 325 mg p.o. b.i.d.  Will hold amlodipine and metoprolol secondary to the patient's  hypotension at the time of discharge.   DISPOSITION:  The patient is follow with Dr. Noel Gerold in the outpatient  clinic on October 27, 2006 at 9:45 a.m.  The patient should have a CBC  checked as well as her blood pressure checked to make sure she is no  longer hypotensive.   PROCEDURES PERFORMED:  1. The patient had acute abdominal series done on October 11, 2006, which      showed nonobstructive bowel gas pattern, no acute cardiopulmonary      disease.  2. The patient had CT of abdomen with contrast on October 11, 2006.      Impression:  1.  Nonspecific thickening of the left adrenal gland.      2.  Bibasilar atelectasis.  3.  Duplicated IVC and normal variant.      4.  Colonic diverticulosis.  3. The patient also had a CT of the pelvis done on October 11, 2006, that      showed bladder distension.  4. The patient also had a nuclear medicine bleeding scan done on October 13, 2006.  Impression:  Bleeding likely originates in the sigmoid      colon.  5. The patient also had a diagnostic barium enema on October 15, 2006.      Impression:  Colonic diverticular disease.  No evidence of bowel      obstruction.  Note that this was an  unprepped barium enema and      therefore polypoid lesions cannot be excluded with certainty.   CONSULTATIONS:  Hedwig Morton. Juanda Chance, M.D. with gastroenterology.   CHIEF COMPLAINT:  Diarrhea and blood in the stool.   The patient's primary care physician is urgent care.   HISTORY OF PRESENT ILLNESS:  Ms. Prindiville is a 60 year old African  American woman with a history of hypertension, tobacco abuse, and a  history of bipolar disorder presenting with a 1-day history of diarrhea  and dark blood in her stool.  The patient reports the diarrhea started  the morning prior to admission.  It was watery and had dark blood mixed  with stool from the beginning.  The patient reports that she went to  church despite the diarrhea hoping that she would feel better but  she  continued to have stools, approximately 3-4 per hour.  The patient  reports that she thinks she lost approximately 3 cups of blood total.  She has no prior episodes of blood in her stools.  She does admit to  taking 6 Aleve a day for many years for chronic leg pain.  She had a  colonoscopy 2 years ago at [facility] which was negative but a polyp was  taken out at that time.  She denies any nausea or vomiting, weight loss,  night sweats.  She had a UTI 2 weeks ago and was treated with  antibiotics.  She does not know which one.   ALLERGIES:  NO KNOWN DRUG ALLERGIES.   PAST MEDICAL HISTORY:  1. Hypertension.  2. Tobacco abuse.  3. Bipolar disorder for whom she sees [facility].  4. Anemia with heme-positive stool cards in 2006.  5. Chronic pain.  6. Total hysterectomy in 1983.   HOME MEDICATIONS:  Metoprolol, Klor-Con, amlodipine, Risperdal, and  Aleve.   SUBSTANCE HISTORY:  The patient is a current smoker up to a pack a day  times greater than 20 years.  She denies alcohol, cocaine, IV drugs.   SOCIAL HISTORY:  The patient is single.  She lives alone.   FAMILY HISTORY:  Mother has hypertension and pacemaker.   Father passed  away at 63, in his sleep.  He had diabetes.  She has one sister with  cancer, another who passed away with an MI at the age of 23.   REVIEW OF SYSTEMS:  As noted in the HPI.   PHYSICAL EXAMINATION:  VITAL SIGNS:  Temperature equals 99, blood  pressure equals 101/61 with repeat of 81-46 and then 121/70.  The  patient had a pulse of 108, respiratory rate 20, O2 sat 100% on 2  liters.  GENERAL:  The patient is in no acute distress, pleasant.  She has stains  of blood on the bed sheet.  EYES:  PERLA, EOMI, anicteric.  ENT:  Clear, poor dentition, the patient is wearing dentures.  NECK:  Supple.  No thyromegaly.  RESPIRATORY:  CTA bilaterally.  CV:  Tachycardic.  Regular.  No murmurs, rubs or gallops.  GI:  Soft, nontender, nondistended, bowel sounds were hypoactive.  EXTREMITIES:  No edema.  GU:  No CVA tenderness.  SKIN:  No rashes, dry skin.  MUSCULOSKELETAL:  The patient is moving all fours.  NEURO:  The patient is alert and oriented x3.  Cranial nerves grossly  intact.  Cerebellar intact heel-to-shin and finger-to-nose.  Motor is  5/5 in all four extremities.  Sensation is intact.  PSYCHIATRIC:  Appropriate.   INITIAL LABORATORY:  Sodium 138, potassium 3.7, chloride 114, bicarb 19,  with a gap of 8, BUN 14, creatinine 0.53, glucose 129.  Bilirubin 0.2,  alk phos 42, AST 14, ALT 11, protein 5, albumin 2.8, calcium 7.8.  The  patient is a FOBT positive.  PT 15.2, PTT 27, INR 1.2.  WBC 8.9,  hemoglobin 6, platelets 247, MCV 90.4, RDW 13.6.   HOSPITAL COURSE:  1. Anemia secondary to acute blood loss secondary to a sigmoid      diverticular bleed.  The patient was admitted to a step-down bed      secondary to her hypotension and her acute blood loss.  The patient      was given several liters of IV fluids normal saline.  During her      hospital stay, the patient received  a total of 8 units of PRBC.      The patient continued to have bloody bowel movements during  her      hospital stay.  GI was consulted and recommended transfusing as      needed as well as keeping her hydrated.  The patient has continued      to have bloody stools, as a result a nuclear medicine bleeding      study was done which revealed that she had sigmoid diverticular      bleeds.  The patient continued to have bloody stools, so a      diagnostic and therapeutic barium enema was done.  This seemed to      help with resolution of the patient's bloody stools and      stabilization of her hemoglobin.  Significant labs that were done      include PT 15.2, INR 1.2, PTT 27.  The patient had a LDH checked      which was 92 and low, and a haptoglobin of 92 and normal with no      signs of hemolysis.  The patient also had a UDS which was negative.      A TSH was checked and it was 1.195 and normal.  The patient also      had an anemia panel which showed RBCs 2.08, reticulocytes 41.6 and      normal, iron low at 18, TIBC low at 237, percent saturation 8 and      low.  B12 306.  Serum folate 6.8 and a ferritin of 12.  The patient      had H.  Pylori checked which was negative.  Fecal lactoferrin was      positive.  C diff was negative.  The patient also had a      homocysteine level which was normal at 9.3, and a methylmalonic      acid which was also normal at 128.  The patient remained      hemodynamically stable on her last couple days of hospitalization.      She should be followed closely for her blood loss which I would      recommend monitoring of CBC.  The patient should also have a blood      pressure measured at followup as well.  The patient will be      discharged on ferrous sulfate 325 mg p.o. b.i.d.  2. Orthostatic hypotension, most likely secondary to the patient's GI      losses as well as her being negative secondary to increase urine      output.  The patient had her orthostatics rechecked prior to      discharge and was normal.  The patient was given IV fluids for  her      hypotension with resolution as mentioned in the previous sentence.  3. Hypertension.  The patient's blood pressure medications were held      during her hospital stay secondary to her anemia and hypotension.      Her blood pressure medications will be held at time of discharge      and should be followed up if the patient's blood pressure begins to      increase again.  4. Bipolar disorder.  The patient was continued on her Risperdal with      no aggravation of her psych symptoms.   DISCHARGE LABORATORY:  WBC 5, hemoglobin 9.2, hematocrit 27.3, MCV 87.1,  RDW 14.4,  platelets 351.  Sodium 140, potassium 3.7 which was  replenished prior to discharge, chloride 112, bicarb 25, glucose 97, BUN  4, creatinine 0.52, calcium 8.4.  The patient also had a repeat CBC done  just prior to her discharge with a white blood cell of 6.9, hemoglobin  10.7, hematocrit 31.9, MCV 88, RDW 14.7, and platelets 391.  Of note,  the first CBC listed was at 6 a.m.   DISCHARGE VITAL SIGNS:  Temperature 97, pulse 101, respiratory rate 20,  blood pressure 126/81, O2 sat 98% on room air.  The patient had  orthostatic vital signs done with a blood pressure while lying 124/78  with a pulse of 102; blood pressure while sitting 129/83 with a pulse of  108;  and blood pressure when standing 121/85, with a pulse of 119.      Rufina Falco, M.D.  Electronically Signed      Eliseo Gum, M.D.  Electronically Signed    JY/MEDQ  D:  10/28/2006  T:  10/28/2006  Job:  [id number]   cc:   Hedwig Morton. Juanda Chance, MD  Dr. Noel Gerold, Outpatient Clinic

## 2010-09-11 ENCOUNTER — Ambulatory Visit (INDEPENDENT_AMBULATORY_CARE_PROVIDER_SITE_OTHER): Payer: Self-pay | Admitting: Internal Medicine

## 2010-09-11 ENCOUNTER — Encounter: Payer: Self-pay | Admitting: Internal Medicine

## 2010-09-11 VITALS — BP 116/77 | HR 82 | Temp 97.6°F | Ht 62.0 in | Wt 194.9 lb

## 2010-09-11 DIAGNOSIS — I10 Essential (primary) hypertension: Secondary | ICD-10-CM

## 2010-09-11 DIAGNOSIS — F17200 Nicotine dependence, unspecified, uncomplicated: Secondary | ICD-10-CM

## 2010-09-11 LAB — BASIC METABOLIC PANEL
CO2: 24 mEq/L (ref 19–32)
Glucose, Bld: 113 mg/dL — ABNORMAL HIGH (ref 70–99)
Potassium: 3.6 mEq/L (ref 3.5–5.3)

## 2010-09-11 NOTE — Assessment & Plan Note (Signed)
Lab Results  Component Value Date   NA 140 01/07/2010   K 3.7 01/07/2010   CL 104 01/07/2010   CO2 25 01/07/2010   BUN 14 01/07/2010   CREATININE 0.76 01/07/2010    BP Readings from Last 3 Encounters:  09/11/10 116/77  01/07/10 124/81  08/27/09 125/84    Assessment: Hypertension control:  controlled  Progress toward goals:  at goal Barriers to meeting goals:  no barriers identified  Plan: Hypertension treatment:  continue current medications Will check renal function and electrolytes today.

## 2010-09-11 NOTE — Patient Instructions (Signed)
Please follow up in 6 months.  Please continue to take all medications as directed.  

## 2010-09-11 NOTE — Assessment & Plan Note (Signed)
Smoking cessation counseling given to patient, however patient does not want to quit at this time.

## 2010-09-11 NOTE — Progress Notes (Signed)
  Subjective:    Patient ID: Pamela Hardin, female    DOB: 1950/07/13, 60 y.o.   MRN: [id number]  HPI  Pamela Hardin is a 60 year old woman with pmh significant for HTN, and Bipolar disorder who presents to the clinic today for general follow-up.   1.) Bipolar Disorder - states she is taking Risperidone as prescribed. No new side effects, and no new complaints of depression and mania.   2.)HTN: Has been taking her medications as prescribed. No new side effects.   Patient has no complaints today. She denies chest pain, cough, sob, headache, nausea or vomiting. She does complain of foot pain, which is a chronic complaint for her. She takes Celebrex as needed.   Review of Systems  All other systems reviewed and are negative.       Objective:   Physical Exam  Constitutional: She is oriented to person, place, and time. She appears well-developed.  HENT:  Head: Normocephalic and atraumatic.  Eyes: Pupils are equal, round, and reactive to light.  Neck: Normal range of motion. Neck supple.  Cardiovascular: Normal rate and regular rhythm.   Pulmonary/Chest: Effort normal and breath sounds normal.  Abdominal: Soft. Bowel sounds are normal.  Neurological: She is alert and oriented to person, place, and time.          Assessment & Plan:

## 2010-09-11 NOTE — Progress Notes (Signed)
  Subjective:    Patient ID: Pamela Hardin, female    DOB: Aug 30, 1950, 60 y.o.   MRN: [id number]  HPI  Ms. Pamela Hardin  Review of Systems     Objective:   Physical Exam        Assessment & Plan:

## 2010-09-12 NOTE — Discharge Summary (Signed)
[facility]  Patient:    Pamela Hardin, Pamela Hardin Visit Number: [id number] MRN: [id number]          Service Type: PSY Location: 40 [phone] [id number] Attending Physician:  Rachael Fee Dictated by:   Reymundo Poll Dub Mikes, M.D. Admit Date:  01/22/2001 Discharge Date: 01/31/2001                             Discharge Summary  CHIEF COMPLAINT AND PRESENTING ILLNESS:  This was the second admission to [facility] for this 60 year old female.  Upon initial evaluation she was calm, smiling, but her affect was incongruent with the visual hallucinations that she described of peoples heads being cut off and blood splashing on the walls when she discussed these visual hallucinations. Complains of hearing voices telling her to do nice things for people such as pray for them.  Involuntary petition states that the patient has a history of mental illness with 1 prior admission to [facility] 4 years ago.  Has been off medication for many years, which she does confirm.  She had been unclear in her thought processes according to the petition, has been seen fighting and fussing with little children that were not there.  Placed ceramic ducks outside of her porch, stating that they were to keep the children out.  Also made statements to her brother that she was running up and down the street at night and knew that people thought she was crazy but she was having fun. Thoughts about her mother and father coming home, but they were deceased. Upon evaluation, denied any suicidal or homicidal ideas.  Denied any recent stressors, but she did say she missed work one day.  She takes Vivarin and many things so that she does not get tired, going through a 4-day sheet every day.  PAST PSYCHIATRIC HISTORY:  Has been at [facility] 4 years ago.  Has not been taking medications.  SUBSTANCE ABUSE HISTORY:  Denies any use of alcohol or illegal drugs  but takes Vivarin and many things to keep herself awake.  Denies any major medical conditions.  PHYSICAL EXAMINATION:  Performed failed to show any active acute findings.  MENTAL STATUS EXAMINATION:  Reveals a well-nourished, well-developed, female, smiling, cooperative, carrying ______ surrounding to wash basin, passing the _______ on right side, opening up patients gown, fully alert. Affect is somewhat incongruent with her discussion of her fearful visual hallucinations of seeing blood on the wall.  Cooperative and polite.  Her speech is normal in pace and tone, without any pressure.  Her mood is guarded at times but generally she is fairly euthymic.  Thought process positive for visual and auditory hallucinations.  No evidence of suicidal or homicidal ideas.  No tangentiality, no flight of ideas.  Cognition is intact.  ADMITTING  DIAGNOSES: Axis I:    Psychotic disorder not otherwise specified. Axis II:   Deferred. Axis III:  Hypokalemia and anemia. Axis IV:   Moderate. Axis V:    Global assessment of function upon admission 25-30, highest            global assessment of function in past year 65.  COURSE IN THE HOSPITAL:  She was admitted and started on intensive individual and group psychotherapy.  She was started on Risperdal that was gradually increased up to 2 mg at bedtime.  She gradually improved and there was a plan to  get her discharged on October 4, but by the time the message got to her that she was being discharged she got upset because it was too late for her to arrange for transportation, so she became agitated, quite out of control, did not respond to attempts to calm her down.  Discharge was cancelled.  She stayed through the weekend.  On October 7 she was much better, slept well. She handled the frustration of the failed discharge without any outbursts. There was no evidence of acute psychosis, therefore discharge was considered and granted.  Upon discharge, in  full contact with reality, no hallucinations, no delusions, no suicidal or homicidal ideas.  DISCHARGE  DIAGNOSES: Axis I:    Psychotic disorder not otherwise specified. Axis II:   No diagnosis. Axis III:  Hypokalemia and anemia. Axis IV:   Moderate. Axis V:    Global assessment of function upon discharge 55-60.  DISCHARGE MEDICATIONS: 1. Risperdal 0.5 twice a day and 3 mg at bedtime. 2. K-Dur 20 mEq twice a day. 3. Ferrous sulfate 325 daily.  DISPOSITION:  To be followed up at [facility] with Dr. Allena Napoleon. Dictated by:   Reymundo Poll Dub Mikes, M.D. Attending Physician:  Rachael Fee DD:  02/28/01 TD:  03/01/01 Job: [id number]

## 2010-09-12 NOTE — H&P (Signed)
[facility]  Patient:    Pamela Hardin, Pamela Hardin Visit Number: [id number] MRN: [id number]          Service Type: PSY Location: 40 [id number] Attending Physician:  Jasmine Pang Dictated by:   Young Berry Scott, N.P. Admit Date:  01/22/2001                           History and Physical  REVIEW OF SYSTEMS:  This is a well-nourished, well-developed African-American female whose hygiene and grooming are good.  She is fully alert and cooperative with todays exam and she has no somatic complaints.  Patient is a smoker and complains of occasional headaches, but she has no headache at this time and no other somatic complaints.  The patient does wear a partial plate of dentures, uppers, and also has glasses but rarely wears them.  The patients preventive care is not up to date, with her last pap smear being approximately 3 years ago.  She does not have regular mammograms and cannot remember the last time she had one.  Vital signs today are temp 97.5, pulse 98, respirations 18, blood pressure 144/96.  She weighs approximately 147 pounds and is 5 feet 1-1/2 inches tall.  HEAD:  Normocephalic and held midline.  No lesions or masses.  No facial tenderness.  NECK:  Supple, no thyromegaly.  CARDIOVASCULAR:  S1 and S2 heard.  No clicks, murmurs, or gallops.   Regular rate and rhythm.  Peripheral pulses are 2+.  She has no lower extremity edema. Extremities are pink and warm, with good capillary refill.  BREAST EXAM:  Deferred.  CHEST:  Symmetrical.  LUNGS:  Clear to auscultation.  ABDOMEN:  Soft, with bowel sounds in all 4 quadrants.  No masses or tenderness.  GENITALIA:  Deferred.  MUSCULOSKELETAL:  The patient ambulates unassisted.  Strength is 5/5.  Spine is straight, posture is upright.  No erythema or swelling evident in any joints.  NEURO:  Cranial nerves 2-12 are intact.  EOMs are intact.  Romberg is without findings.  Deep tendon reflexes are 1+/4  and are symmetrical. Dictated by:   Young Berry. Scott, N.P. Attending Physician:  Jasmine Pang DD:  01/26/01 TD:  01/26/01 Job: [id number]

## 2010-09-12 NOTE — H&P (Signed)
[facility]  Patient:    Pamela Hardin, Pamela Hardin Visit Number: [id number] MRN: [id number]          Service Type: PSY Location: 40 [id number] Attending Physician:  Jasmine Pang Dictated by:   Young Berry Scott, N.P. Admit Date:  01/22/2001                     Psychiatric Admission Assessment  DATE OF ADMISSION:  January 22, 2001.  DATE OF ASSESSMENT:  January 23, 2001, at 3:15 p.m.  PATIENT IDENTIFICATION:  This is a 60 year old African-American female who is an involuntary admission for bizarre behavior.  HISTORY OF PRESENT ILLNESS:  This patient presents today with a calm manner and a smiling affect, quite incongruent to the visual hallucinations that she describes of peoples heads being cut off and blood splashing on the walls. She is reluctant to discuss these visual hallucinations with me, but does after some encouragement.  The patient also complains of hearing voices telling her to "do nice things for people, such as pray for them".  The involuntary petition states that patient has a history of mental illness with one prior admission to [facility] four years ago.  Apparently, she has been off of medication for many years, which she does confirm.  The patient has been unclear in her thought process according to the petition and has been seen fighting and fussing with little children that were not there and placed ceramic dogs outside on her porch, stating that they were to keep the children out.  The patient had also made statements to her brother that she was running up and down the street at night and knew that people thought she was crazy, but actually she was having fun, and also about her mother and father coming home, but they are deceased.  Patient today denies any suicidal ideation or homicidal ideation or any command hallucinations that refer to suicide.  The patient has no memory of running up and down the street.  Her  affect is somewhat bizarre, with a calm smile throughout all discussions.  She denies any recent stressors, but then does talk repeatedly about missing work one day this week.  She also does report that she takes Vivarin tablets and "Mini-Thins" so that she does not get tired going through a full shift at work everyday.  PAST PSYCHIATRIC HISTORY:  The patient denies any current followup.  She was previously seen at [facility] a long time ago.  She states that she went twice, but never went back and stopped taking medications.  SOCIAL HISTORY:  The patient has never married.  She has no children.  She was educated through the twelfth grade, she states in special education classes, and had difficulty with schooling the whole time growing up.  She reports that she lived with her mother and father for many years, but has lived on her own since age 58.  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  FAMILY HISTORY:  Unclear.  ALCOHOL AND DRUG HISTORY:  The patient denies any use of alcohol or illegal drugs, but does take Vivarin and Mini-Thins, as reported above.  MEDICAL HISTORY:  The patient does not report that she has any specific medical doctor, but has seen Dr. Huntley Dec, an OB/GYN in [place] in the past.  She denies any current medical problems.  She denies taking any medications.  Denies any  drug allergies.  POSITIVE PHYSICAL FINDINGS:  The patients PE is pending.  Her labs show her potassium currently at 2.9 and her hemoglobin low at 11.1, low hematocrit at 33.4.  Red cell distribution was at 16.6 and monocytes at 16.  The patients BUN is 7 and her creatinine is 0.6.  Her T3 uptake is mildly elevated at 41.4, but her thyroid stimulating hormone is within normal limits at 1.258.  The patient has a urine drug screen and urinalysis currently pending.  MENTAL STATUS EXAMINATION:  The patient is with a calm manner.   She presents smiling and cooperative.  She is carrying all of her belongings around in two wash basins that are tied up in a patient gown.  She is fully alert.  Her affect is somewhat incongruent with her discussion of her fearful visual hallucinations of seeing blood on the walls.  She is cooperative and polite. Her speech is normal in pace and tone, without any pressure.  Her mood is a bit guarded at times, but generally is fairly euthymic.  Thought processes are positive for visual and auditory hallucinations.  No evidence of suicidal or homicidal thoughts.  No tangentiality.  No flight of ideas.  Cognitively, she is intact to person and place, but not to date or circumstance.  She does have some difficulty about recalling the weeks activities at work and some specific things.  Her intellect is limited.  Her judgment is impaired. Insight is poor and impulse control is impaired.  ADMISSION DIAGNOSES: Axis I:    Psychosis, not otherwise specified. Axis II:   Deferred. Axis III:  Hypokalemia and anemia, not otherwise specified. Axis IV:   Deferred. Axis V:    Current 24, past year 51.  INITIAL PLAN OF CARE:  Involuntary admission to improve her reality testing with a goal to eliminate auditory and visual hallucinations and permit the patient to return to her routine activities of daily living and live independently.  We will contact [facility] for their past records, particularly related to her medications.  We will attempt to contact her brother for additional history.  He is not available now, although we have tried.  We have elected to start her on Risperdal 0.25 mg p.o. b.i.d. and 0.5 mg at h.s.  We also have her on Haldol 5 mg IM p.r.n. for hallucinations or agitation.  We will also put her on Cogentin 1 mg q.4h. p.r.n. for EPS.  We will make sure her urinalysis is pending and we will get a urine drug screen on her.  ESTIMATED LENGTH OF STAY:  Seven  days. Dictated by:   Young Berry Scott, N.P. Attending Physician:  Jasmine Pang DD:  01/23/01 TD:  01/23/01 Job: [id number]

## 2010-10-01 ENCOUNTER — Encounter (INDEPENDENT_AMBULATORY_CARE_PROVIDER_SITE_OTHER): Payer: BC Managed Care – PPO | Admitting: Psychiatry

## 2010-10-01 DIAGNOSIS — F3189 Other bipolar disorder: Secondary | ICD-10-CM

## 2010-12-31 ENCOUNTER — Encounter (INDEPENDENT_AMBULATORY_CARE_PROVIDER_SITE_OTHER): Payer: BC Managed Care – PPO | Admitting: Psychiatry

## 2010-12-31 DIAGNOSIS — F259 Schizoaffective disorder, unspecified: Secondary | ICD-10-CM

## 2011-01-20 LAB — POCT I-STAT 4, (NA,K, GLUC, HGB,HCT)
Glucose, Bld: 99
HCT: 45
Hemoglobin: 15.3 — ABNORMAL HIGH

## 2011-02-11 LAB — CBC
HCT: 18 — ABNORMAL LOW
HCT: 22.5 — ABNORMAL LOW
HCT: 23.2 — ABNORMAL LOW
HCT: 25.1 — ABNORMAL LOW
HCT: 25.9 — ABNORMAL LOW
HCT: 27.3 — ABNORMAL LOW
HCT: 27.7 — ABNORMAL LOW
HCT: 28.7 — ABNORMAL LOW
HCT: 29.4 — ABNORMAL LOW
HCT: 29.6 — ABNORMAL LOW
HCT: 30 — ABNORMAL LOW
HCT: 30.1 — ABNORMAL LOW
HCT: 30.5 — ABNORMAL LOW
HCT: 31 — ABNORMAL LOW
HCT: 34.5 — ABNORMAL LOW
HCT: 34.9 — ABNORMAL LOW
Hemoglobin: 10.1 — ABNORMAL LOW
Hemoglobin: 10.3 — ABNORMAL LOW
Hemoglobin: 10.5 — ABNORMAL LOW
Hemoglobin: 10.7 — ABNORMAL LOW
Hemoglobin: 10.7 — ABNORMAL LOW
Hemoglobin: 11.4 — ABNORMAL LOW
Hemoglobin: 11.4 — ABNORMAL LOW
Hemoglobin: 7.4 — CL
Hemoglobin: 7.9 — CL
Hemoglobin: 8.7 — ABNORMAL LOW
Hemoglobin: 9.2 — ABNORMAL LOW
Hemoglobin: 9.4 — ABNORMAL LOW
Hemoglobin: 9.9 — ABNORMAL LOW
MCHC: 33.1
MCHC: 33.1
MCHC: 33.2
MCHC: 33.4
MCHC: 33.5
MCHC: 33.5
MCHC: 33.6
MCHC: 33.7
MCHC: 33.8
MCHC: 33.8
MCHC: 34.1
MCHC: 34.2
MCV: 86.8
MCV: 87.4
MCV: 87.5
MCV: 87.6
MCV: 88
MCV: 88.2
MCV: 88.5
MCV: 88.5
MCV: 88.6
MCV: 89
MCV: 89.2
MCV: 89.6
MCV: 90.1
Platelets: 131 — ABNORMAL LOW
Platelets: 134 — ABNORMAL LOW
Platelets: 136 — ABNORMAL LOW
Platelets: 146 — ABNORMAL LOW
Platelets: 151
Platelets: 152
Platelets: 157
Platelets: 190
Platelets: 193
Platelets: 241
Platelets: 244
Platelets: 257
Platelets: 312
RBC: 1.99 — ABNORMAL LOW
RBC: 2.49 — ABNORMAL LOW
RBC: 2.61 — ABNORMAL LOW
RBC: 3.13 — ABNORMAL LOW
RBC: 3.26 — ABNORMAL LOW
RBC: 3.35 — ABNORMAL LOW
RBC: 3.38 — ABNORMAL LOW
RBC: 3.45 — ABNORMAL LOW
RBC: 3.57 — ABNORMAL LOW
RBC: 3.62 — ABNORMAL LOW
RBC: 3.62 — ABNORMAL LOW
RBC: 3.63 — ABNORMAL LOW
RBC: 3.94
RDW: 14
RDW: 14.3 — ABNORMAL HIGH
RDW: 14.3 — ABNORMAL HIGH
RDW: 14.5 — ABNORMAL HIGH
RDW: 14.5 — ABNORMAL HIGH
RDW: 14.6 — ABNORMAL HIGH
RDW: 14.7 — ABNORMAL HIGH
RDW: 14.7 — ABNORMAL HIGH
RDW: 14.7 — ABNORMAL HIGH
RDW: 14.8 — ABNORMAL HIGH
RDW: 14.8 — ABNORMAL HIGH
RDW: 14.8 — ABNORMAL HIGH
RDW: 14.9 — ABNORMAL HIGH
RDW: 15 — ABNORMAL HIGH
WBC: 11 — ABNORMAL HIGH
WBC: 5.6
WBC: 5.9
WBC: 6.2
WBC: 6.3
WBC: 6.5
WBC: 6.7
WBC: 6.8
WBC: 6.8
WBC: 7.6
WBC: 8
WBC: 8.9
WBC: 9

## 2011-02-11 LAB — MAGNESIUM
Magnesium: 1.6
Magnesium: 1.8
Magnesium: 1.9
Magnesium: 2
Magnesium: 2.1

## 2011-02-11 LAB — URINALYSIS, ROUTINE W REFLEX MICROSCOPIC
Bilirubin Urine: NEGATIVE
Bilirubin Urine: NEGATIVE
Glucose, UA: NEGATIVE
Hgb urine dipstick: NEGATIVE
Ketones, ur: NEGATIVE
Nitrite: NEGATIVE
Protein, ur: NEGATIVE
Specific Gravity, Urine: 1.006
Specific Gravity, Urine: 1.025
Urobilinogen, UA: 0.2
pH: 6

## 2011-02-11 LAB — FECAL LACTOFERRIN, QUANT

## 2011-02-11 LAB — CROSSMATCH: ABO/RH(D): A POS

## 2011-02-11 LAB — CLOSTRIDIUM DIFFICILE EIA

## 2011-02-11 LAB — PROTIME-INR
INR: 1.2
Prothrombin Time: 15.2

## 2011-02-11 LAB — COMPREHENSIVE METABOLIC PANEL
ALT: <8
Albumin: 2 — ABNORMAL LOW
Albumin: 2.8 — ABNORMAL LOW
Alkaline Phosphatase: 31 — ABNORMAL LOW
BUN: 14
CO2: 19
GFR calc Af Amer: >60
GFR calc non Af Amer: >60
Potassium: 3.5
Potassium: 3.7
Sodium: 138
Sodium: 139
Total Bilirubin: 0.2 — ABNORMAL LOW
Total Protein: 3.8 — ABNORMAL LOW
Total Protein: 5 — ABNORMAL LOW

## 2011-02-11 LAB — CULTURE, BLOOD (ROUTINE X 2)

## 2011-02-11 LAB — BASIC METABOLIC PANEL
BUN: 2 — ABNORMAL LOW
BUN: 2 — ABNORMAL LOW
BUN: 3 — ABNORMAL LOW
BUN: 4 — ABNORMAL LOW
BUN: 5 — ABNORMAL LOW
BUN: 5 — ABNORMAL LOW
CO2: 21
CO2: 24
CO2: 25
Calcium: 7.4 — ABNORMAL LOW
Calcium: 7.5 — ABNORMAL LOW
Calcium: 7.6 — ABNORMAL LOW
Calcium: 8 — ABNORMAL LOW
Calcium: 8.3 — ABNORMAL LOW
Calcium: 8.3 — ABNORMAL LOW
Calcium: 8.4
Chloride: 110
Chloride: 113 — ABNORMAL HIGH
Creatinine, Ser: 0.44
Creatinine, Ser: 0.51
Creatinine, Ser: 0.52
Creatinine, Ser: 0.54
GFR calc Af Amer: >60
GFR calc Af Amer: >60
GFR calc Af Amer: >60
GFR calc Af Amer: >60
GFR calc Af Amer: >60
GFR calc non Af Amer: >60
GFR calc non Af Amer: >60
GFR calc non Af Amer: >60
GFR calc non Af Amer: >60
GFR calc non Af Amer: >60
Glucose, Bld: 102 — ABNORMAL HIGH
Glucose, Bld: 105 — ABNORMAL HIGH
Glucose, Bld: 105 — ABNORMAL HIGH
Glucose, Bld: 140 — ABNORMAL HIGH
Glucose, Bld: 90
Glucose, Bld: 99
Potassium: 3.7
Potassium: 3.8
Sodium: 139
Sodium: 140
Sodium: 140
Sodium: 142

## 2011-02-11 LAB — URINE MICROSCOPIC-ADD ON

## 2011-02-11 LAB — STOOL CULTURE

## 2011-02-11 LAB — RETICULOCYTES: Retic Ct Pct: 2

## 2011-02-11 LAB — IRON AND TIBC
Saturation Ratios: 8 — ABNORMAL LOW
TIBC: 237 — ABNORMAL LOW

## 2011-02-11 LAB — LACTATE DEHYDROGENASE: LDH: 92 — ABNORMAL LOW

## 2011-02-11 LAB — URINE CULTURE: Special Requests: POSITIVE

## 2011-02-11 LAB — LIPID PANEL
Triglycerides: 77
VLDL: 15

## 2011-02-11 LAB — POCT I-STAT 3, ART BLOOD GAS (G3+)
Bicarbonate: 17.7 — ABNORMAL LOW
Patient temperature: 99.5
TCO2: 19
pH, Arterial: 7.383
pO2, Arterial: 84

## 2011-02-11 LAB — PREPARE RBC (CROSSMATCH)

## 2011-02-11 LAB — ACTH STIMULATION, 3 TIME POINTS: Cortisol, 60 Min: 24 (ref >20)

## 2011-02-11 LAB — CK TOTAL AND CKMB (NOT AT ARMC)
CK, MB: 0.8
Relative Index: INVALID
Total CK: 53

## 2011-02-11 LAB — RAPID URINE DRUG SCREEN, HOSP PERFORMED: Tetrahydrocannabinol: NOT DETECTED

## 2011-02-11 LAB — HOMOCYSTEINE: Homocysteine: 9.3

## 2011-02-11 LAB — VITAMIN B12: Vitamin B-12: 315 (ref 211–911)

## 2011-02-11 LAB — PATHOLOGIST SMEAR REVIEW

## 2011-02-11 LAB — HAPTOGLOBIN: Haptoglobin: 92

## 2011-02-11 LAB — TROPONIN I: Troponin I: 0.01

## 2011-02-11 LAB — PREGNANCY, URINE: Preg Test, Ur: NEGATIVE

## 2011-04-01 ENCOUNTER — Encounter (HOSPITAL_COMMUNITY): Payer: Self-pay | Admitting: Psychiatry

## 2011-04-01 ENCOUNTER — Ambulatory Visit (INDEPENDENT_AMBULATORY_CARE_PROVIDER_SITE_OTHER): Payer: BC Managed Care – PPO | Admitting: Psychiatry

## 2011-04-01 DIAGNOSIS — F259 Schizoaffective disorder, unspecified: Secondary | ICD-10-CM

## 2011-04-01 MED ORDER — RISPERIDONE 3 MG PO TABS: 3.0000 mg | ORAL_TABLET | Freq: Every day | ORAL | Status: DC

## 2011-04-01 NOTE — Progress Notes (Signed)
Patient came for her followup appointment. She has been off from her Risperdal for a few days as she did not get the [facility]. She endorsed that she forgot to call for refills. Patient endorse poor sleep irritability and some paranoid thinking but denies any agitation anger or mood swings. She had a good Thanksgiving and she is looking forward to have a good Christmas. She reported no side effects of medication. She need refills of her Risperdal. She denies any tremors shakes or extrapyramidal side effects.  Mental status examination Patient is casually dressed and fairly groomed. She maintained poor eye contact. Her speech is slow in her thought process were loose and circumstantial. She denies any active or passive suicidal thinking and homicidal thinking. Her attention and concentration is poor. There are no delusions however endorse some paranoid thinking which are vague and nonspecific. She is alert and oriented x3 she denies any auditory or visual hallucination. Her insight judgment and impulse control is okay  Assessment Schizophrenia chronic paranoid type  Plan I will gi a new prescription of Risperdal 3 mg at bedtime. I for his for medication compliance and talk about risk of relapse and not taking medication. She understands degree to continue to take medication regularly. I will see her again in 10 weeks. I recommended to call if she has any question or concern her side effects of the medication.

## 2011-06-10 ENCOUNTER — Ambulatory Visit (INDEPENDENT_AMBULATORY_CARE_PROVIDER_SITE_OTHER): Payer: BC Managed Care – PPO | Admitting: Psychiatry

## 2011-06-10 ENCOUNTER — Encounter (HOSPITAL_COMMUNITY): Payer: Self-pay | Admitting: Psychiatry

## 2011-06-10 VITALS — Wt 202.0 lb

## 2011-06-10 DIAGNOSIS — F200 Paranoid schizophrenia: Secondary | ICD-10-CM

## 2011-06-10 MED ORDER — RISPERIDONE 3 MG PO TABS: 3.0000 mg | ORAL_TABLET | Freq: Every day | ORAL | Status: DC

## 2011-06-10 NOTE — Progress Notes (Signed)
Chief complaint I need my medication  History of presenting illness Patient is 61 year old single African American, employed female who came for her followup appointment. Patient has been seen in this office since 2003 for paranoid schizophrenia. She has been pretty stable on Risperdal 3 mg at bedtime. She reported no side effects of medication. She continues to have occasional hallucination and paranoid thinking but overall her mood sleep and thoughts are okay. She has any agitation anger or depressive thoughts. 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 She likes her current medication and need a refill.  Past psychiatric history Patient has one psychiatric admission in 2003 due to psychosis. She denies any history of suicidal attempt. She has been taking Risperdal since 2003.  Medical history Patient has history of hypertension, diverticulosis, chronic pain and anemia  Psychosocial history Patient was born and raised in [place]. She never married and has no children. She has 7 other siblings him in this area. Patient has a good relationship with her siblings. She denies any history of abuse.  Alcohol and substance use history Patient has a history of alcohol use or any illegal substance  Mental status examination Patient is fairly groomed and maintained poor eye contact. She is cooperative. Her speech is slow but clear and coherent. Her thought process is also slow but logical linear and goal-directed. Her attention and concentration is distracted at times. She denies any active or passive suicidal thoughts or homicidal thoughts. She denies any orderly or visual hallucination however endorse at times paranoid thinking. There no delusions present at this time. She's alert and oriented x3. Her insight judgment impulse control is okay.  Assessment Axis I chronic schizophrenia paranoid type Axis II deferred Axis III see medical history Axis IV mild to moderate Axis V  60-65  Plan I will continue Risperdal 3 mg at bedtime. At this time patient is not experiencing any side effects including any EPS or metabolic syndrome. She will schedule to have blood work in March at urgent care. I told to have blood results faxed to [place]. I recommended to call us if she has any question or concern about the medication or if she feels worsening of the symptoms. I will see her again in 3 months.

## 2011-06-25 ENCOUNTER — Encounter: Payer: BC Managed Care – PPO | Admitting: Internal Medicine

## 2011-07-16 ENCOUNTER — Encounter: Payer: BC Managed Care – PPO | Admitting: Internal Medicine

## 2011-08-26 ENCOUNTER — Ambulatory Visit (INDEPENDENT_AMBULATORY_CARE_PROVIDER_SITE_OTHER): Payer: BC Managed Care – PPO | Admitting: Internal Medicine

## 2011-08-26 ENCOUNTER — Encounter: Payer: Self-pay | Admitting: Internal Medicine

## 2011-08-26 VITALS — BP 118/83 | HR 78 | Temp 97.0°F | Ht 63.0 in | Wt 197.6 lb

## 2011-08-26 DIAGNOSIS — Z0000 Encounter for general adult medical examination without abnormal findings: Secondary | ICD-10-CM | POA: Insufficient documentation

## 2011-08-26 DIAGNOSIS — I10 Essential (primary) hypertension: Secondary | ICD-10-CM

## 2011-08-26 DIAGNOSIS — Z1231 Encounter for screening mammogram for malignant neoplasm of breast: Secondary | ICD-10-CM

## 2011-08-26 DIAGNOSIS — D509 Iron deficiency anemia, unspecified: Secondary | ICD-10-CM

## 2011-08-26 LAB — ANEMIA PANEL
ABS Retic: 48.6 10*3/uL (ref 19.0–186.0)
Folate: 10.6 ng/mL
Iron: 56 ug/dL (ref 42–145)
Retic Ct Pct: 1.1 % (ref 0.4–2.3)
TIBC: 351 ug/dL (ref 250–470)
UIBC: 295 ug/dL (ref 125–400)
Vitamin B-12: 560 pg/mL (ref 211–911)

## 2011-08-26 LAB — CBC
HCT: 37.3 % (ref 36.0–46.0)
MCH: 27.1 pg (ref 26.0–34.0)
MCHC: 32.2 g/dL (ref 30.0–36.0)
MCV: 84.4 fL (ref 78.0–100.0)
Platelets: 343 10*3/uL (ref 150–400)
RBC: 4.42 MIL/uL (ref 3.87–5.11)

## 2011-08-26 MED ORDER — PANTOPRAZOLE SODIUM 40 MG PO TBEC: 40.0000 mg | DELAYED_RELEASE_TABLET | Freq: Every day | ORAL | Status: DC

## 2011-08-26 NOTE — Assessment & Plan Note (Signed)
Asymptomatic currently. Will check anemia panel and CBC today.

## 2011-08-26 NOTE — Patient Instructions (Signed)
Please follow up in 6 months. Please take medications as directed.

## 2011-08-26 NOTE — Assessment & Plan Note (Signed)
Lab Results  Component Value Date   NA 142 09/11/2010   K 3.6 09/11/2010   CL 108 09/11/2010   CO2 24 09/11/2010   BUN 12 09/11/2010   CREATININE 0.66 09/11/2010   CREATININE 0.76 01/07/2010    BP Readings from Last 3 Encounters:  08/26/11 118/83  09/11/10 116/77  01/07/10 124/81    Assessment: Hypertension control:  controlled  Progress toward goals:  at goal Barriers to meeting goals:  no barriers identified  Plan: Hypertension treatment:  continue current medications check bmet

## 2011-08-26 NOTE — Progress Notes (Signed)
Subjective:     Patient ID: Pamela Hardin, female   DOB: [birth year], 61 y.o.   MRN: [id number]  HPI Pamela Hardin is a 61 year old woman with history of Bipolar disorder managed by outpatient Psych, HTN, and GERD who presents for general check up. She has no new complaints or concerns and would like a flu shot as she has not had one.  Patient has no other complaints or concerns today. She denies chest pain, cough, sob, headache, N/V, changes in abdominal and urinary character.   Review of Systems  All other systems reviewed and are negative.       Objective:   Physical Exam  Constitutional: She appears well-developed.  HENT:  Head: Normocephalic.  Eyes: Pupils are equal, round, and reactive to light.  Neck: Normal range of motion.  Cardiovascular: Normal rate and regular rhythm.   Pulmonary/Chest: Effort normal and breath sounds normal.  Abdominal: Soft.  Psychiatric: She has a normal mood and affect.

## 2011-08-26 NOTE — Assessment & Plan Note (Signed)
Mammogram and flu shot today

## 2011-09-16 ENCOUNTER — Ambulatory Visit (HOSPITAL_COMMUNITY): Payer: Self-pay | Admitting: Psychiatry

## 2011-09-18 ENCOUNTER — Ambulatory Visit (HOSPITAL_COMMUNITY)
Admission: RE | Admit: 2011-09-18 | Discharge: 2011-09-18 | Disposition: A | Discharge status: Home / Self Care | Payer: BC Managed Care – PPO | Source: Ambulatory Visit | Attending: Internal Medicine | Admitting: Internal Medicine

## 2011-09-18 DIAGNOSIS — Z1231 Encounter for screening mammogram for malignant neoplasm of breast: Secondary | ICD-10-CM | POA: Insufficient documentation

## 2011-10-12 ENCOUNTER — Ambulatory Visit (INDEPENDENT_AMBULATORY_CARE_PROVIDER_SITE_OTHER): Payer: BC Managed Care – PPO | Admitting: Internal Medicine

## 2011-10-12 ENCOUNTER — Encounter: Payer: Self-pay | Admitting: Internal Medicine

## 2011-10-12 VITALS — BP 134/89 | HR 110 | Temp 98.0°F | Ht 63.0 in | Wt 197.6 lb

## 2011-10-12 DIAGNOSIS — I10 Essential (primary) hypertension: Secondary | ICD-10-CM

## 2011-10-12 DIAGNOSIS — E669 Obesity, unspecified: Secondary | ICD-10-CM | POA: Insufficient documentation

## 2011-10-12 LAB — TSH: TSH: 0.908 u[IU]/mL (ref 0.350–4.500)

## 2011-10-12 LAB — LIPID PANEL
HDL: 44 mg/dL (ref >39)
Total CHOL/HDL Ratio: 3.8 Ratio
VLDL: 13 mg/dL (ref 0–40)

## 2011-10-12 MED ORDER — METOPROLOL TARTRATE 50 MG PO TABS: 50.0000 mg | ORAL_TABLET | Freq: Two times a day (BID) | ORAL | Status: DC

## 2011-10-12 NOTE — Patient Instructions (Addendum)
Continue your excellent recent weight loss. Please try to get at least 30 minutes of aerobic exercise 3-4 days a week.   Followup with her new PCP in one year.Please bring all your medications to your next clinic appointment.    If any of your lab results are abnormal, we will contact you by phone or send you a letter. If they are normal, we will not contact you, but will be happy to discuss them at your next clinic appointment.   Exercise to Lose Weight Exercise and a healthy diet may help you lose weight. Your doctor may suggest specific exercises. EXERCISE IDEAS AND TIPS  Choose low-cost things you enjoy doing, such as walking, bicycling, or exercising to workout videos.   Take stairs instead of the elevator.   Walk during your lunch break.   Park your car further away from work or school.   Go to a gym or an exercise class.   Start with 5 to 10 minutes of exercise each day. Build up to 30 minutes of exercise 4 to 6 days a week.   Wear shoes with good support and comfortable clothes.   Stretch before and after working out.   Work out until you breathe harder and your heart beats faster.   Drink extra water when you exercise.   Do not do so much that you hurt yourself, feel dizzy, or get very short of breath.  Exercises that burn about 150 calories:  Running 1  miles in 15 minutes.   Playing volleyball for 45 to 60 minutes.   Washing and waxing a car for 45 to 60 minutes.   Playing touch football for 45 minutes.   Walking 1  miles in 35 minutes.   Pushing a stroller 1  miles in 30 minutes.   Playing basketball for 30 minutes.   Raking leaves for 30 minutes.   Bicycling 5 miles in 30 minutes.   Walking 2 miles in 30 minutes.   Dancing for 30 minutes.   Shoveling snow for 15 minutes.   Swimming laps for 20 minutes.   Walking up stairs for 15 minutes.   Bicycling 4 miles in 15 minutes.   Gardening for 30 to 45 minutes.   Jumping rope for 15  minutes.   Washing windows or floors for 45 to 60 minutes.  Document Released: 05/16/2010 Document Revised: 04/02/2011 Document Reviewed: 05/16/2010 [facility] Patient Information 2012 [place], [place].

## 2011-10-12 NOTE — Progress Notes (Signed)
  Subjective:   Patient ID: Pamela Hardin female   DOB: 05-06-1950 61 y.o.   MRN: [id number]  HPI: [name] is a 61 y.o. woman with past medical history significant for bipolar disorder, hypertension, GERD who presents for management of blood pressure  She states she ran out of her metoprolol approximately one week ago. She checked her blood pressure this past Wednesday at work and was found to be 157/80. She is currently having no side effects from her medications. Denies shortness of breath, dizziness, lightheadedness, chest pain, abdominal pain, palpitations or tachycardia.   Past Medical History  Diagnosis Date  . Diverticulosis 2008    sigmoid diverticular bleed in June 2008 s/p colonoscopy  . Anemia     iron deficiency  . Tobacco abuse   . HTN (hypertension)   . Bipolar 1 disorder    Current Outpatient Prescriptions  Medication Sig Dispense Refill  . CELEBREX 200 MG capsule       . pantoprazole (PROTONIX) 40 MG tablet Take 1 tablet (40 mg total) by mouth daily.  30 tablet  5  . risperiDONE (RISPERDAL) 3 MG tablet Take 1 tablet (3 mg total) by mouth at bedtime. Prescribed by [facility]  90 tablet  0  . amLODipine (NORVASC) 10 MG tablet Take 1 tablet (10 mg total) by mouth daily.  90 tablet  4  . metoprolol (LOPRESSOR) 50 MG tablet Take 1 tablet (50 mg total) by mouth 2 (two) times daily.  180 tablet  4  . DISCONTD: metoprolol (LOPRESSOR) 50 MG tablet Take 1 tablet (50 mg total) by mouth 2 (two) times daily.  180 tablet  3   No family history on file. History   Social History  . Marital Status: Single    Spouse Name: N/A    Number of Children: N/A  . Years of Education: N/A   Social History Main Topics  . Smoking status: Current Everyday Smoker  . Smokeless tobacco: None   Comment: 1pack/week  . Alcohol Use: No  . Drug Use: No  . Sexually Active: None   Other Topics Concern  . 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  Patient is elderly overweight woman in no acute distress and cooperative with   Cardiovascular: Regular rate and rhythm on recheck. No murmurs normal S1 and S2. Intact distal pulses Pulmonary: Clear to auscultation bilaterally Extremities: Trace pedal edema  Assessment & Plan:    Case discussed with Dr. Rogelia Boga. Please see problem oriented charting for assessment and plan by problem (best viewed under encounters tab).

## 2011-10-12 NOTE — Assessment & Plan Note (Addendum)
Patient's weight has steadily increased over the past few years. Will check TSH and lipid panel as she has not had these in a while. Discussed diet and exercise modification. Referral placed for her to visit with Norm Parcel our clinic nutritionist.   Results addendum: Lab Results  Component Value Date   TSH 0.908 10/12/2011  Normal TSH. Patient most likely has common obesity.    Lab Results  Component Value Date   CHOL 166 10/12/2011   HDL 44 10/12/2011   LDLCALC 109* 10/12/2011   TRIG 66 10/12/2011   CHOLHDL 3.8 10/12/2011   LDL not significantly elevated. Will continue to pursue lifestyle modifications at this time.

## 2011-10-12 NOTE — Assessment & Plan Note (Signed)
Prescription for metoprolol 50 mg twice a day restarted. Also discussed how exercise can decrease blood pressure.

## 2011-11-03 ENCOUNTER — Encounter: Payer: Self-pay | Admitting: *Deleted

## 2011-11-18 NOTE — Addendum Note (Signed)
Addended by: Neomia Dear on: 11/18/2011 07:17 PM   Modules accepted: Orders

## 2013-05-02 ENCOUNTER — Encounter: Payer: Self-pay | Admitting: Internal Medicine

## 2013-06-14 ENCOUNTER — Encounter: Payer: Self-pay | Admitting: Podiatry

## 2013-06-14 ENCOUNTER — Ambulatory Visit: Payer: Self-pay | Admitting: Podiatry

## 2013-06-14 ENCOUNTER — Ambulatory Visit: Payer: BC Managed Care – PPO | Admitting: Podiatry

## 2013-06-14 VITALS — BP 113/73 | HR 83 | Resp 16 | Ht 62.5 in | Wt 190.0 lb

## 2013-06-14 DIAGNOSIS — B351 Tinea unguium: Secondary | ICD-10-CM

## 2013-06-14 DIAGNOSIS — M7750 Other enthesopathy of unspecified foot: Secondary | ICD-10-CM

## 2013-06-14 DIAGNOSIS — L600 Ingrowing nail: Secondary | ICD-10-CM

## 2013-06-14 MED ORDER — CELECOXIB 200 MG PO CAPS: 200.0000 mg | ORAL_CAPSULE | Freq: Every day | ORAL | Status: DC

## 2013-06-14 MED ORDER — TRIAMCINOLONE ACETONIDE 10 MG/ML IJ SUSP
10.0000 mg | Freq: Once | INTRAMUSCULAR | Status: AC
  Administered 2013-06-14: 10 mg

## 2013-06-14 NOTE — Progress Notes (Signed)
   Subjective:    Patient ID: Pamela LeitzBeverly J Hardin, female    DOB: 1950/06/28, 63 y.o.   MRN: [id number]  HPI Comments: "I want this toenail removed"  Patient has very dark, thick toenail 1st right for about 3 years. The toenail has been removed before.   Also, pt is requesting a shot into the left medial ankle. She states that she periodically gets injections.     Review of Systems  All other systems reviewed and are negative.       Objective:   Physical Exam        Assessment & Plan:

## 2013-06-14 NOTE — Patient Instructions (Signed)

## 2013-06-15 NOTE — Progress Notes (Signed)
Subjective:     Patient ID: Pamela LeitzBeverly J Hardin, female   DOB: October 20, 1950, 63 y.o.   MRN: [id number]  HPI patient presents stating my right big toenail has been really bothering me due due its thickness and deformity and my ankles are starting to bother me quite a bit more at this time. States it's been going on for several years she has just been too busy with work to get into the office   Review of Systems  All other systems reviewed and are negative.       Objective:   Physical Exam  Nursing note and vitals reviewed. Constitutional: She is oriented to person, place, and time.  Cardiovascular: Intact distal pulses.   Musculoskeletal: Normal range of motion.  Neurological: She is oriented to person, place, and time.  Skin: Skin is warm.   neurovascular status intact with significant range of motion loss subtalar joint of both feet with mild to moderate equinus condition of both feet and severe thickness of the right hallux nail at this time. 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 Injected the sinus tarsi of both feet 3 mg Kenalog 5 mg Xylocaine Marcaine mixture and recommended removal of the right hallux nail. Explained risk of nail surgery patient wants the procedure and today I infiltrated 60 mg Xylocaine Marcaine mixture remove the hallux nail exposed matrix and applied chemical 5 applications of phenol 30 seconds followed by alcohol lavaged and sterile dressing. Given instructions on soaks and reappoint

## 2013-06-21 ENCOUNTER — Telehealth: Payer: Self-pay | Admitting: *Deleted

## 2013-06-21 NOTE — Telephone Encounter (Signed)
Coverage review for Celebrex, prior authorization# [id number] valid until 06/21/2014.  Pt has hx of gastric bleeds, and diverticulosis with bleeding.

## 2017-02-23 ENCOUNTER — Emergency Department (HOSPITAL_COMMUNITY)
Admission: EM | Admit: 2017-02-23 | Discharge: 2017-02-25 | Disposition: A | Discharge status: Home / Self Care | Payer: Medicare PPO | Attending: Emergency Medicine | Admitting: Emergency Medicine

## 2017-02-23 ENCOUNTER — Ambulatory Visit (HOSPITAL_COMMUNITY)
Admission: RE | Admit: 2017-02-23 | Discharge: 2017-02-23 | Disposition: A | Discharge status: Home / Self Care | Payer: Medicare PPO | Source: Home / Self Care | Attending: Psychiatry | Admitting: Psychiatry

## 2017-02-23 ENCOUNTER — Encounter (HOSPITAL_COMMUNITY): Payer: Self-pay | Admitting: *Deleted

## 2017-02-23 ENCOUNTER — Emergency Department (HOSPITAL_COMMUNITY): Payer: Medicare PPO

## 2017-02-23 DIAGNOSIS — Z79899 Other long term (current) drug therapy: Secondary | ICD-10-CM | POA: Insufficient documentation

## 2017-02-23 DIAGNOSIS — F17210 Nicotine dependence, cigarettes, uncomplicated: Secondary | ICD-10-CM | POA: Diagnosis not present

## 2017-02-23 DIAGNOSIS — N390 Urinary tract infection, site not specified: Secondary | ICD-10-CM | POA: Diagnosis present

## 2017-02-23 DIAGNOSIS — F4325 Adjustment disorder with mixed disturbance of emotions and conduct: Secondary | ICD-10-CM | POA: Diagnosis present

## 2017-02-23 DIAGNOSIS — Z008 Encounter for other general examination: Secondary | ICD-10-CM

## 2017-02-23 DIAGNOSIS — F29 Unspecified psychosis not due to a substance or known physiological condition: Secondary | ICD-10-CM | POA: Diagnosis not present

## 2017-02-23 DIAGNOSIS — F1210 Cannabis abuse, uncomplicated: Secondary | ICD-10-CM | POA: Diagnosis not present

## 2017-02-23 DIAGNOSIS — D649 Anemia, unspecified: Secondary | ICD-10-CM | POA: Insufficient documentation

## 2017-02-23 DIAGNOSIS — I10 Essential (primary) hypertension: Secondary | ICD-10-CM | POA: Diagnosis not present

## 2017-02-23 DIAGNOSIS — F209 Schizophrenia, unspecified: Secondary | ICD-10-CM | POA: Diagnosis not present

## 2017-02-23 LAB — CBC WITH DIFFERENTIAL/PLATELET
BASOS ABS: 0 10*3/uL (ref 0.0–0.1)
Basophils Relative: 1 %
EOS PCT: 2 %
Eosinophils Absolute: 0.1 10*3/uL (ref 0.0–0.7)
HCT: 35.2 % — ABNORMAL LOW (ref 36.0–46.0)
Hemoglobin: 11.3 g/dL — ABNORMAL LOW (ref 12.0–15.0)
LYMPHS ABS: 1.7 10*3/uL (ref 0.7–4.0)
LYMPHS PCT: 35 %
MCH: 27 pg (ref 26.0–34.0)
MCHC: 32.1 g/dL (ref 30.0–36.0)
MCV: 84.2 fL (ref 78.0–100.0)
MONO ABS: 0.9 10*3/uL (ref 0.1–1.0)
MONOS PCT: 18 %
Neutro Abs: 2.2 10*3/uL (ref 1.7–7.7)
Neutrophils Relative %: 44 %
PLATELETS: 278 10*3/uL (ref 150–400)
RBC: 4.18 MIL/uL (ref 3.87–5.11)
RDW: 16.3 % — AB (ref 11.5–15.5)
WBC: 4.9 10*3/uL (ref 4.0–10.5)

## 2017-02-23 LAB — COMPREHENSIVE METABOLIC PANEL
ALBUMIN: 3.6 g/dL (ref 3.5–5.0)
ALT: 16 U/L (ref 14–54)
ANION GAP: 8 (ref 5–15)
AST: 25 U/L (ref 15–41)
Alkaline Phosphatase: 64 U/L (ref 38–126)
BILIRUBIN TOTAL: 0.4 mg/dL (ref 0.3–1.2)
BUN: 10 mg/dL (ref 6–20)
CO2: 25 mmol/L (ref 22–32)
Calcium: 8.9 mg/dL (ref 8.9–10.3)
Chloride: 108 mmol/L (ref 101–111)
Creatinine, Ser: 0.66 mg/dL (ref 0.44–1.00)
GFR calc Af Amer: >60 mL/min (ref >60)
GFR calc non Af Amer: >60 mL/min (ref >60)
GLUCOSE: 86 mg/dL (ref 65–99)
POTASSIUM: 3 mmol/L — AB (ref 3.5–5.1)
SODIUM: 141 mmol/L (ref 135–145)
TOTAL PROTEIN: 7.2 g/dL (ref 6.5–8.1)

## 2017-02-23 LAB — URINALYSIS, ROUTINE W REFLEX MICROSCOPIC
BILIRUBIN URINE: NEGATIVE
Glucose, UA: NEGATIVE mg/dL
Hgb urine dipstick: NEGATIVE
KETONES UR: NEGATIVE mg/dL
Nitrite: POSITIVE — AB
PH: 6 (ref 5.0–8.0)
Protein, ur: NEGATIVE mg/dL
Specific Gravity, Urine: 1.026 (ref 1.005–1.030)

## 2017-02-23 LAB — RAPID URINE DRUG SCREEN, HOSP PERFORMED
Amphetamines: NOT DETECTED
BARBITURATES: NOT DETECTED
BENZODIAZEPINES: NOT DETECTED
COCAINE: NOT DETECTED
Opiates: NOT DETECTED
Tetrahydrocannabinol: POSITIVE — AB

## 2017-02-23 LAB — ETHANOL: Alcohol, Ethyl (B): <10 mg/dL (ref <10)

## 2017-02-23 LAB — ACETAMINOPHEN LEVEL: Acetaminophen (Tylenol), Serum: <10 ug/mL — ABNORMAL LOW (ref 10–30)

## 2017-02-23 LAB — SALICYLATE LEVEL: Salicylate Lvl: <7 mg/dL (ref 2.8–30.0)

## 2017-02-23 MED ORDER — CEPHALEXIN 500 MG PO CAPS
500.0000 mg | ORAL_CAPSULE | Freq: Two times a day (BID) | ORAL | Status: DC
  Administered 2017-02-23 – 2017-02-25 (×4): 500 mg via ORAL
  Filled 2017-02-23 (×4): qty 1

## 2017-02-23 MED ORDER — AMLODIPINE BESYLATE 5 MG PO TABS
10.0000 mg | ORAL_TABLET | Freq: Every day | ORAL | Status: DC
  Administered 2017-02-23 – 2017-02-25 (×3): 10 mg via ORAL
  Filled 2017-02-23 (×3): qty 2

## 2017-02-23 MED ORDER — POTASSIUM CHLORIDE CRYS ER 20 MEQ PO TBCR
40.0000 meq | EXTENDED_RELEASE_TABLET | Freq: Once | ORAL | Status: AC
  Administered 2017-02-23: 40 meq via ORAL
  Filled 2017-02-23: qty 2

## 2017-02-23 MED ORDER — ONDANSETRON HCL 4 MG PO TABS
4.0000 mg | ORAL_TABLET | Freq: Three times a day (TID) | ORAL | Status: DC | PRN
Start: 2017-02-23 — End: 2017-02-25

## 2017-02-23 MED ORDER — ACETAMINOPHEN 325 MG PO TABS: 650.0000 mg | ORAL_TABLET | ORAL | Status: DC | PRN

## 2017-02-23 MED ORDER — METOPROLOL TARTRATE 25 MG PO TABS
50.0000 mg | ORAL_TABLET | Freq: Two times a day (BID) | ORAL | Status: DC
  Administered 2017-02-23 – 2017-02-25 (×4): 50 mg via ORAL
  Filled 2017-02-23 (×4): qty 2

## 2017-02-23 MED ORDER — ALUM & MAG HYDROXIDE-SIMETH 200-200-20 MG/5ML PO SUSP
30.0000 mL | Freq: Four times a day (QID) | ORAL | Status: DC | PRN
  Administered 2017-02-24: 30 mL via ORAL
  Filled 2017-02-23 (×2): qty 30

## 2017-02-23 NOTE — ED Notes (Signed)
Pt admitted to room #39. Pt denies SI/HI. Pt endorsing AH. Pt reports  "I want to stay here till Monday." Pt forwards little with this nurse. Pt irritable at times, defensive. Encouragement and support provided. Special checks q 15 mins in place for safety, video monitoring in place. Will continue to monitor.

## 2017-02-23 NOTE — ED Notes (Signed)
Requested a urine sample. 

## 2017-02-23 NOTE — ED Notes (Signed)
Pt given scrubs to change into 

## 2017-02-23 NOTE — ED Notes (Signed)
Pamela Hardin inventory pt belongings found green substance, appears to be marijuana. Pamela Hardin and Fayrene FearingJames security flushed substance. Pt made aware.

## 2017-02-23 NOTE — ED Notes (Signed)
Patient wanded by security. 

## 2017-02-23 NOTE — ED Notes (Signed)
Patient denies SI/HI/VH. Patient reports AH. Plan of care discussed. Encouragement and support provided and safety maintain. Q 15 min safety checks remain in place and video monitoring.  

## 2017-02-23 NOTE — ED Notes (Signed)
Pt denies SI/HI, denies AVH

## 2017-02-23 NOTE — ED Notes (Signed)
Bed: [facility]39 Expected date:  Expected time:  Means of arrival:  Comments: 26 

## 2017-02-23 NOTE — ED Notes (Signed)
[name] patient nephew would like to be notified when patient is transferred. [phone].

## 2017-02-23 NOTE — BH Assessment (Signed)
Assessment Note  Pamela Hardin is an 66 y.o. female present walk-in to [facility] accompanied by  Her nephew Hadlei Stitt), niece-in-law Debby Freiberg Durwin Nora) and niece Archie Patten Troxler). Family and patient poor historian. Patient's family report they are concerned for the patient safety. Family report patient has a history of Schizophrenia. Patient lives alone. Patient will not always allow others in her home. Patient will look at the window and refuse to open does when others visit. Per family, patient is starting to wonder. Patient talks to herself report talking to dolls and animals. [Per family patient has not been medication for a least 3 to 4 years. Patient does not have a primary care physician. Patient visits Urgent Care frequency.    Patient report she talks to her dolls. Patient report thoughts and voices in her head that only she can hear. Patient reports the radio talks to her when she drives instructing her where to go. Patient report her dolls tell her what to do and how to act. Writer unable to confirm SI, HI, AVH. Per family, they have no concern of SI, IH, AVH only concern of patient hurting.     Diagnosis: F20.9 Schizophrenia  Disposition: Per Leighton Ruff, NP, patient needs medical clearance via [facility] then recommends inpatient geropsych  Past Medical History:  Past Medical History:  Diagnosis Date  . Anemia    iron deficiency  . Bipolar 1 disorder   . Diverticulosis 2008   sigmoid diverticular bleed in June 2008 s/p colonoscopy  . HTN (hypertension)   . Tobacco abuse     Past Surgical History:  Procedure Laterality Date  . TOTAL ABDOMINAL HYSTERECTOMY  1984    Family History: No family history on file.  Social History:  reports that she has been smoking.  She does not have any smokeless tobacco history on file. She reports that she does not drink alcohol or use drugs.  Additional Social History:  Alcohol / Drug Use Pain Medications: see MAR Prescriptions: see  MAR Over the Counter: see MAR History of alcohol / drug use?: No history of alcohol / drug abuse  CIWA: CIWA-Ar BP: (!) 124/94 Pulse Rate: 89 COWS:    Allergies: No Known Allergies  Home Medications:  (Not in a hospital admission)  OB/GYN Status:  No LMP recorded. Patient has had a hysterectomy.  General Assessment Data Location of Assessment: [facility] TTS Assessment: In system Is this a Tele or Face-to-Face Assessment?: Face-to-Face Is this an Initial Assessment or a Re-assessment for this encounter?: Initial Assessment Marital status: Single (never married) Is patient pregnant?: No Pregnancy Status: No Living Arrangements: Alone Can pt return to current living arrangement?: Yes Admission Status: Voluntary Is patient capable of signing voluntary admission?: Yes Referral Source: Self/Family/Friend Insurance type: [employer] Medicare  Medical Screening Exam [facility] Walk-in ONLY) Medical Exam completed: No Reason for MSE not completed: Other: (Patient transported to [facility] for Medical Clearance)  Crisis Care Plan Living Arrangements: Alone Name of Psychiatrist: none report (report patient has not been taking medication past 3 or 4 yr) Name of Therapist: none report     Risk to self with the past 6 months Suicidal Ideation: No Has patient been a risk to self within the past 6 months prior to admission? : No Suicidal Intent: No Has patient had any suicidal intent within the past 6 months prior to admission? : No Is patient at risk for suicide?: No Suicidal Plan?: No Has patient had any suicidal plan within the past  6 months prior to admission? : No Access to Means: No What has been your use of drugs/alcohol within the last 12 months?: none report Previous Attempts/Gestures: No How many times?: 0 Other Self Harm Risks: none report Intentional Self Injurious Behavior: None Family Suicide History: Unknown  Risk to Others within the past 6  months Homicidal Ideation: No  Psychosis Hallucinations: None noted Delusions: None noted  Mental Status Report Appearance/Hygiene: Layered clothes Eye Contact: Fair Motor Activity: Freedom of movement Speech: Logical/coherent Level of Consciousness: Alert Mood: Anxious Affect: Anxious Anxiety Level: Minimal Thought Processes: Unable to Assess Judgement: Impaired (dementia ) Orientation: Person, Place, Time, Situation, Appropriate for developmental age  Cognitive Functioning Concentration: Unable to Assess Memory: Unable to Assess IQ: Average Insight: see judgement above Impulse Control: Poor Appetite: Good Sleep: Unable to Assess  ADLScreening [facility] Assessment Services) Patient's cognitive ability adequate to safely complete daily activities?: Yes Patient able to express need for assistance with ADLs?: Yes Independently performs ADLs?: Yes (appropriate for developmental age)  Prior Inpatient Therapy Prior Inpatient Therapy: No  Prior Outpatient Therapy Prior Outpatient Therapy: No Does patient have an ACCT team?: No Does patient have Intensive In-House Services?  : No Does patient have [facility] services? : No Does patient have P4CC services?: No  ADL Screening (condition at time of admission) Patient's cognitive ability adequate to safely complete daily activities?: Yes Is the patient deaf or have difficulty hearing?: No Does the patient have difficulty seeing, even when wearing glasses/contacts?: No Does the patient have difficulty concentrating, remembering, or making decisions?: No Patient able to express need for assistance with ADLs?: Yes Does the patient have difficulty dressing or bathing?: No Independently performs ADLs?: Yes (appropriate for developmental age) Does the patient have difficulty walking or climbing stairs?: No       Abuse/Neglect Assessment (Assessment to be complete while patient is alone) Physical Abuse: Denies Verbal Abuse:  Denies Sexual Abuse: Denies Exploitation of patient/patient's resources: Denies Self-Neglect: Denies     Merchant navy officerAdvance Directives (For Healthcare) Does Patient Have a Medical Advance Directive?: No Would patient like information on creating a medical advance directive?: No - Patient declined    Additional Information 1:1 In Past 12 Months?: No CIRT Risk: No Elopement Risk: No Does patient have medical clearance?: No     Disposition:  Per Leighton Ruffina Okonkwo, NP, patient needs medical clearance [facility] then recommends inpatient geropsych Disposition Initial Assessment Completed for this Encounter: Yes Disposition of Patient: Re-evaluation by Psychiatry recommended  On Site Evaluation by:   Reviewed with Physician:  Leighton Ruffina Okonkwo, NP  Ocie Cornfieldelvondria [facility] 02/23/2017 2:55 PM

## 2017-02-23 NOTE — ED Provider Notes (Signed)
[facility] DEPT Provider Note   CSN: [id number] Arrival date & time: 02/23/17  1315     History   Chief Complaint Chief Complaint  Patient presents with  . Medical Clearance    HPI Pamela Hardin is a 66 y.o. female with a history of bipolar type I, hypertension, diverticulosis, anemia who presents emerged department today for medical clearance.  Patient was seen by nurse practitioner at behavioral health today and found to meet inpatient criteria for Geropsych. Was sent over for medical clearance.  Patient presented to behavioral health with family (nephew, niece in law, niece) with concern of patient safety.  The patient does live alone.  She says she lives at home with her dolls and animals.  She has not been on her psychiatric medication for the last 3-4 years.  Her behavioral health note says that the patient talks to her dolls. She denies any SI, HI or AVH to myself. BH notes says family reports to them she hears voices in her head that only she can.  She also hears a radio talk when she drives instructing her vertigo.  Patient denies any physical complaints at this time.  HPI  Past Medical History:  Diagnosis Date  . Anemia    iron deficiency  . Bipolar 1 disorder ([place])   . Diverticulosis 2008   sigmoid diverticular bleed in June 2008 s/p colonoscopy  . HTN (hypertension)   . Tobacco abuse     Patient Active Problem List   Diagnosis Date Noted  . Obesity (BMI 30-39.9) 10/12/2011  . Preventative health care 08/26/2011  . TOBACCO ABUSE 07/22/2009  . ANEMIA-IRON DEFICIENCY 10/27/2006  . BIPOLAR AFFECTIVE DISORDER 10/27/2006  . HYPERTENSION 10/27/2006  . FOOT PAIN, CHRONIC 10/27/2006  . GASTROINTESTINAL HEMORRHAGE, HX OF 10/27/2006  . HYSTERECTOMY, TOTAL, HX OF 10/27/2006  . DIVERTICULOSIS, COLON, WITH HEMORRHAGE 10/14/2006    Past Surgical History:  Procedure Laterality Date  . TOTAL ABDOMINAL HYSTERECTOMY  1984    OB History      No data available       Home Medications    Prior to Admission medications   Medication Sig Start Date End Date Taking? Authorizing Provider  amLODipine (NORVASC) 10 MG tablet Take 1 tablet (10 mg total) by mouth daily. 06/26/10   Jolene Provost, MD  CELEBREX 200 MG capsule  07/04/10   [provider]  celecoxib (CELEBREX) 200 MG capsule Take 1 capsule (200 mg total) by mouth daily. 06/14/13   Wallene Huh, DPM  metoprolol (LOPRESSOR) 50 MG tablet Take 1 tablet (50 mg total) by mouth 2 (two) times daily. 10/12/11 10/11/12  Clarene Duke, MD  pantoprazole (PROTONIX) 40 MG tablet Take 1 tablet (40 mg total) by mouth daily. 08/26/11   Jolene Provost, MD  risperiDONE (RISPERDAL) 3 MG tablet Take 1 tablet (3 mg total) by mouth at bedtime. Prescribed by [facility] 06/10/11   Arfeen, Arlyce Harman, MD    Family History History reviewed. No pertinent family history.  Social History Social History  Substance Use Topics  . Smoking status: Current Every Day Smoker  . Smokeless tobacco: Never Used     Comment: 1pack/week  . Alcohol use Yes     Comment: rum     Allergies   Patient has no known allergies.   Review of Systems Review of Systems  Constitutional: Negative for chills and fever.  Eyes: Negative for pain.  Respiratory: Negative for cough and shortness of breath.   Cardiovascular:  Negative for chest pain and palpitations.  Gastrointestinal: Negative for abdominal pain, diarrhea, nausea and vomiting.  Genitourinary: Negative for difficulty urinating and urgency.  Musculoskeletal: Negative for arthralgias and myalgias.  Skin: Negative for rash.  Neurological: Negative for weakness and headaches.  All other systems reviewed and are negative.    Physical Exam Updated Vital Signs Ht 5' 2"  (1.575 m)   Physical Exam  Constitutional: She appears well-developed and well-nourished.  HENT:  Head: Normocephalic and atraumatic.  Right Ear: External ear normal.  Left Ear: External  ear normal.  Nose: Nose normal.  Mouth/Throat: Uvula is midline, oropharynx is clear and moist and mucous membranes are normal. No tonsillar exudate.  Eyes: Pupils are equal, round, and reactive to light. Right eye exhibits no discharge. Left eye exhibits no discharge. No scleral icterus.  Neck: Trachea normal and normal range of motion. Neck supple. No spinous process tenderness present. No neck rigidity. Normal range of motion present.  Cardiovascular: Normal rate, regular rhythm and intact distal pulses.   No murmur heard. Pulses:      Radial pulses are 2+ on the right side, and 2+ on the left side.       Dorsalis pedis pulses are 2+ on the right side, and 2+ on the left side.       Posterior tibial pulses are 2+ on the right side, and 2+ on the left side.  No lower extremity swelling or edema. Calves symmetric in size bilaterally.  Pulmonary/Chest: Effort normal and breath sounds normal. She exhibits no tenderness.  Abdominal: Soft. Bowel sounds are normal. There is no tenderness. There is no rebound and no guarding.  Musculoskeletal: She exhibits no edema.  Lymphadenopathy:    She has no cervical adenopathy.  Neurological: She is alert.  Speech clear. Follows commands. No facial droop. PERRLA. EOMI. Normal peripheral fields. CN III-XII intact.  Grossly moves all extremities 4 without ataxia.  Able and appropriate strength for age to upper and lower extremities bilaterally including grip strength and plantar flexion/dorsiflexion. Sensation to light touch intact bilaterally for upper and lower.  Skin: Skin is warm and dry. Capillary refill takes less than 2 seconds. No rash noted. She is not diaphoretic.  Psychiatric: She has a normal mood and affect.  Nursing note and vitals reviewed.    ED Treatments / Results  Labs (all labs ordered are listed, but only abnormal results are displayed) Labs Reviewed  URINALYSIS, ROUTINE W REFLEX MICROSCOPIC  COMPREHENSIVE METABOLIC PANEL    ETHANOL  RAPID URINE DRUG SCREEN, HOSP PERFORMED  CBC WITH DIFFERENTIAL/PLATELET  SALICYLATE LEVEL  ACETAMINOPHEN LEVEL    EKG  EKG Interpretation None       Radiology Dg Chest 1 View  Result Date: 02/23/2017 CLINICAL DATA:  Slight cough. EXAM: CHEST 1 VIEW COMPARISON:  No prior . FINDINGS: Mediastinum hilar structures normal. Mild left mid lung field subsegmental atelectasis. No focal infiltrate. No pleural effusion or pneumothorax. Mild cardiomegaly. No pulmonary venous congestion . IMPRESSION: 1. Mild left mid lung field subsegmental atelectasis. No focal infiltrate . 2. Mild cardiomegaly.  No pulmonary venous congestion . Electronically Signed   By: Marcello Moores  Register   On: 02/23/2017 15:37    Procedures Procedures (including critical care time)  Medications Ordered in ED Medications - No data to display   Initial Impression / Assessment and Plan / ED Course  I have reviewed the triage vital signs and the nursing notes.  Pertinent labs & imaging results that were available during my care  of the patient were reviewed by me and considered in my medical decision making (see chart for details).     Pt presents to the ED for medical clearance after meeting inpatient geropysch criteria by behavorial health.  Pt is not currently having SI or HI ideations. The patient currently does not have any acute physical complaints and is in no acute distress. Med clearance labs ordered.  Labs are reassuring sides mild hypokalemia that will treat orally.  Urinalysis does show nitrite positive and trace leukocytes.  Will treat with Keflex and sent for urine culture. Patient already has met geropsych. She is medically cleared.   Final Clinical Impressions(s) / ED Diagnoses   Final diagnoses:  Evaluation by psychiatric service required    New Prescriptions New Prescriptions   No medications on file     Lorelle Gibbs 02/23/17 2100    Mesner, Corene Cornea, MD 02/25/17 1443

## 2017-02-23 NOTE — ED Triage Notes (Signed)
[facility].  Pt sts she lives alone and needs evaluation, but sts she doesn't have any complaints

## 2017-02-23 NOTE — ED Notes (Signed)
ED Provider at bedside. PA maczis

## 2017-02-23 NOTE — ED Notes (Signed)
Gave report to ashely RN

## 2017-02-23 NOTE — ED Notes (Signed)
[facility] here for [facility]-needs geri-psych placement

## 2017-02-23 NOTE — H&P (Signed)
Behavioral Health Medical Screening Exam  Pamela Hardin is an 66 y.o. female [facility] her niece, nephew and nephew's wife. Patient lives alone and communicates with her dolls. She stated that her dolls tell her what to do. Patient became physically aggressive towards this Clinical research associatewriter when she was told that we will be sending her to the hospital. She balls up her hands and coming towards Clinical research associatewriter charging at her.   Total Time spent with patient: 30 minutes  Psychiatric Specialty Exam: Physical Exam  Nursing note and vitals reviewed. Constitutional: She is oriented to person, place, and time. She appears well-developed and well-nourished.  HENT:  Head: Normocephalic and atraumatic.  Eyes: Pupils are equal, round, and reactive to light.  Neck: Normal range of motion.  Cardiovascular: Normal rate, regular rhythm and normal heart sounds.   Respiratory: Effort normal and breath sounds normal.  GI: Soft. Bowel sounds are normal.  Musculoskeletal: Normal range of motion.  Neurological: She is alert and oriented to person, place, and time.  Skin: Skin is warm and dry.    Review of Systems  Psychiatric/Behavioral: Positive for hallucinations. Negative for depression, memory loss, substance abuse and suicidal ideas. The patient is nervous/anxious. The patient does not have insomnia.     Blood pressure (!) 124/94, pulse 89, temperature 97.6 F (36.4 C), temperature source Oral, resp. rate 18, SpO2 100 %.There is no height or weight on file to calculate BMI.  General Appearance: Bizarre and Disheveled  Eye Contact:  Good  Speech:  Clear and Coherent and Normal Rate  Volume:  Normal  Mood:  Anxious, Depressed and agitated  Affect:  Congruent and ambivalent  Thought Process:  Coherent and Goal Directed  Orientation:  Full (Time, Place, and Person)  Thought Content:  Paranoid Ideation  Suicidal Thoughts:  No  Homicidal Thoughts:  No  Memory:  Immediate;    Good Recent;   Fair Remote;   Fair  Judgement:  Poor  Insight:  Lacking  Psychomotor Activity:  Normal  Concentration: Concentration: Fair and Attention Span: Fair  Recall:  FiservFair  Fund of Knowledge:Fair  Language: Good  Akathisia:  Negative  Handed:  Right  AIMS (if indicated):     Assets:  Communication Skills Housing Leisure Time Physical Health Social Support  Sleep:       Musculoskeletal: Strength & Muscle Tone: within normal limits Gait & Station: normal Patient leans: N/A  Blood pressure (!) 124/94, pulse 89, temperature 97.6 F (36.4 C), temperature source Oral, resp. rate 18, SpO2 100 %.  Recommendations:  Based on my evaluation the patient appears to have an emergency medical condition for which I recommend the patient be transferred to the emergency department for further evaluation.  Delila PereyraJustina A Santresa Levett, NP 02/23/2017, 12:45 PM

## 2017-02-23 NOTE — [facility] Counselor (Signed)
Disposition: Per Leighton Ruffina Okonkwo, NP, patient needs [facility] then recommends inpatient geropsych

## 2017-02-23 NOTE — ED Notes (Signed)
This nurse notified EDP of pt UA result.

## 2017-02-24 DIAGNOSIS — F209 Schizophrenia, unspecified: Secondary | ICD-10-CM | POA: Diagnosis not present

## 2017-02-24 DIAGNOSIS — F17210 Nicotine dependence, cigarettes, uncomplicated: Secondary | ICD-10-CM

## 2017-02-24 DIAGNOSIS — F29 Unspecified psychosis not due to a substance or known physiological condition: Secondary | ICD-10-CM

## 2017-02-24 DIAGNOSIS — F1210 Cannabis abuse, uncomplicated: Secondary | ICD-10-CM | POA: Diagnosis not present

## 2017-02-24 NOTE — ED Notes (Signed)
Pt A/O, no noted distress. Denies SI/HI/AVH. Called and educated nephew on pt. Nephew, Kennith Centerracey will be assisting pt with dr's appt. He will visit [facility] to find out the physician that last prescribed her medication and reach out to him/her for visit. Pt is pleasant, calm, and cooperative. Staff will  Continue to monitor, maintain safety, and meet needs.

## 2017-02-24 NOTE — Consult Note (Signed)
[place] [facility] Consult   Reason for Consult:  Psychosis Referring Physician:  EDP Patient Identification: Pamela Hardin MRN:  [id number] Principal Diagnosis: Schizophrenia [facility]) Diagnosis:   Patient Active Problem List   Diagnosis Date Noted  . Schizophrenia ([place]) [F20.9] 02/24/2017  . Obesity (BMI 30-39.9) [E66.9] 10/12/2011  . Preventative health care [Z00.00] 08/26/2011  . TOBACCO ABUSE [F17.200] 07/22/2009  . ANEMIA-IRON DEFICIENCY [D50.9] 10/27/2006  . BIPOLAR AFFECTIVE DISORDER [F31.9] 10/27/2006  . HYPERTENSION [I10] 10/27/2006  . FOOT PAIN, CHRONIC [M79.609] 10/27/2006  . GASTROINTESTINAL HEMORRHAGE, HX OF [Z87.19] 10/27/2006  . HYSTERECTOMY, TOTAL, HX OF [Z90.79] 10/27/2006  . DIVERTICULOSIS, COLON, WITH HEMORRHAGE [K57.31] 10/14/2006    Total Time spent with patient: 45 minutes  Subjective:   Pamela Hardin is a 66 y.o. female patient admitted with psychosis.  HPI:   [name] family brought her to the ED due to concern for her safety with living home alone. They provided information to the EDP that patient refuses to allow her family to come into her home and help her with daily activities due to paranoia. She has also been responding to internal stimuli. She has not received medications for the past 3-4 years. According to TTS, the family also reported that she has been stealing items from her neighbors. On interview, she reports that she is doing okay. When asked why she is in the hospital she reports, "whatever you guys are doing for me." She is able to provide her home address where she lives. She reports that she has been happy although she has a history of depression. She has not taken medication except what was given to her at the hospital. She denies SI, HI or AVH. She admits to talking to her dolls because she is lonely at home. She denies paranoid thoughts. She denies difficulty cooking and cleaning for herself. She denies having UTI symptoms.    Past Psychiatric History: Schizophrenia  Risk to Self: Is patient at risk for suicide?: No Risk to Others:   None  Prior Inpatient Therapy:  None Prior Outpatient Therapy:  None  Past Medical History:  Past Medical History:  Diagnosis Date  . Anemia    iron deficiency  . Bipolar 1 disorder ([place])   . Diverticulosis 2008   sigmoid diverticular bleed in June 2008 s/p colonoscopy  . HTN (hypertension)   . Tobacco abuse     Past Surgical History:  Procedure Laterality Date  . TOTAL ABDOMINAL HYSTERECTOMY  1984   Family History: History reviewed. No pertinent family history.   Family Psychiatric  History: Unknown  Social History:  History  Alcohol Use  . Yes    Comment: rum     History  Drug Use  . Types: Marijuana    Comment: uses frequently    Social History   Social History  . Marital status: Single    Spouse name: N/A  . Number of children: N/A  . Years of education: N/A   Social History Main Topics  . Smoking status: Current Every Day Smoker  . Smokeless tobacco: Never Used     Comment: 1pack/week  . Alcohol use Yes     Comment: rum  . Drug use: Yes    Types: Marijuana     Comment: uses frequently  . Sexual activity: Not Currently   Other Topics Concern  . 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     Allergies:  No Known Allergies  Labs:  Results for orders placed or performed during the hospital encounter of 02/23/17 (from the past 48 hour(s))  Comprehensive metabolic panel     Status: Abnormal   Collection Time: 02/23/17  3:49 PM  Result Value Ref Range   Sodium 141 135 - 145 mmol/L   Potassium 3.0 (L) 3.5 -  5.1 mmol/L   Chloride 108 101 - 111 mmol/L   CO2 25 22 - 32 mmol/L   Glucose, Bld 86 65 - 99 mg/dL   BUN 10 6 - 20 mg/dL   Creatinine, Ser 0.66 0.44 - 1.00 mg/dL   Calcium 8.9 8.9 - 10.3 mg/dL   Total Protein 7.2 6.5 - 8.1 g/dL   Albumin 3.6 3.5 - 5.0 g/dL   AST 25 15 - 41 U/L   ALT 16 14 - 54 U/L   Alkaline Phosphatase 64 38 - 126 U/L   Total Bilirubin 0.4 0.3 - 1.2 mg/dL   GFR calc non Af Amer >60 >60 mL/min   GFR calc Af Amer >60 >60 mL/min    Comment: (NOTE) The eGFR has been calculated using the CKD EPI equation. This calculation has not been validated in all clinical situations. eGFR's persistently <60 mL/min signify possible Chronic Kidney Disease.    Anion gap 8 5 - 15  CBC with Diff     Status: Abnormal   Collection Time: 02/23/17  3:49 PM  Result Value Ref Range   WBC 4.9 4.0 - 10.5 K/uL   RBC 4.18 3.87 - 5.11 MIL/uL   Hemoglobin 11.3 (L) 12.0 - 15.0 g/dL   HCT 35.2 (L) 36.0 - 46.0 %   MCV 84.2 78.0 - 100.0 fL   MCH 27.0 26.0 - 34.0 pg   MCHC 32.1 30.0 - 36.0 g/dL   RDW 16.3 (H) 11.5 - 15.5 %   Platelets 278 150 - 400 K/uL   Neutrophils Relative % 44 %   Neutro Abs 2.2 1.7 - 7.7 K/uL   Lymphocytes Relative 35 %   Lymphs Abs 1.7 0.7 - 4.0 K/uL   Monocytes Relative 18 %   Monocytes Absolute 0.9 0.1 - 1.0 K/uL   Eosinophils Relative 2 %   Eosinophils Absolute 0.1 0.0 - 0.7 K/uL   Basophils Relative 1 %   Basophils Absolute 0.0 0.0 - 0.1 K/uL  Urinalysis, Routine w reflex microscopic     Status: Abnormal   Collection Time: 02/23/17  4:15 PM  Result Value Ref Range   Color, Urine YELLOW YELLOW   APPearance HAZY (A) CLEAR   Specific Gravity, Urine 1.026 1.005 - 1.030   pH 6.0 5.0 - 8.0   Glucose, UA NEGATIVE NEGATIVE mg/dL   Hgb urine dipstick NEGATIVE NEGATIVE   Bilirubin Urine NEGATIVE NEGATIVE   Ketones, ur NEGATIVE NEGATIVE mg/dL   Protein, ur NEGATIVE NEGATIVE mg/dL   Nitrite POSITIVE (A) NEGATIVE   Leukocytes, UA TRACE (A) NEGATIVE   RBC / HPF 0-5 0  - 5 RBC/hpf   WBC, UA 6-30 0 - 5 WBC/hpf   Bacteria, UA MANY (A) NONE SEEN   Squamous  Epithelial / LPF 6-30 (A) NONE SEEN   Mucus PRESENT   Urine rapid drug screen (hosp performed)     Status: Abnormal   Collection Time: 02/23/17  4:15 PM  Result Value Ref Range   Opiates NONE DETECTED NONE DETECTED   Cocaine NONE DETECTED NONE DETECTED   Benzodiazepines NONE DETECTED NONE DETECTED   Amphetamines NONE DETECTED NONE DETECTED   Tetrahydrocannabinol POSITIVE (A) NONE DETECTED   Barbiturates NONE DETECTED NONE DETECTED    Comment:        DRUG SCREEN FOR MEDICAL PURPOSES ONLY.  IF CONFIRMATION IS NEEDED FOR ANY PURPOSE, NOTIFY LAB WITHIN 5 DAYS.        LOWEST DETECTABLE LIMITS FOR URINE DRUG SCREEN Drug Class       Cutoff (ng/mL) Amphetamine      1000 Barbiturate      200 Benzodiazepine   854 Tricyclics       627 Opiates          300 Cocaine          300 THC              50   Ethanol     Status: None   Collection Time: 02/23/17  4:54 PM  Result Value Ref Range   Alcohol, Ethyl (B) <10 <10 mg/dL    Comment:        LOWEST DETECTABLE LIMIT FOR SERUM ALCOHOL IS 10 mg/dL FOR MEDICAL PURPOSES ONLY   Salicylate level     Status: None   Collection Time: 02/23/17  4:54 PM  Result Value Ref Range   Salicylate Lvl <0.3 2.8 - 30.0 mg/dL  Acetaminophen level     Status: Abnormal   Collection Time: 02/23/17  4:54 PM  Result Value Ref Range   Acetaminophen (Tylenol), Serum <10 (L) 10 - 30 ug/mL    Comment:        THERAPEUTIC CONCENTRATIONS VARY SIGNIFICANTLY. A RANGE OF 10-30 ug/mL MAY BE AN EFFECTIVE CONCENTRATION FOR MANY PATIENTS. HOWEVER, SOME ARE BEST TREATED AT CONCENTRATIONS OUTSIDE THIS RANGE. ACETAMINOPHEN CONCENTRATIONS >150 ug/mL AT 4 HOURS AFTER INGESTION AND >50 ug/mL AT 12 HOURS AFTER INGESTION ARE OFTEN ASSOCIATED WITH TOXIC REACTIONS.     Current Facility-Administered Medications  Medication Dose Route Frequency Provider Last Rate Last Dose  .  acetaminophen (TYLENOL) tablet 650 mg  650 mg Oral Q4H PRN Maczis, Barth Kirks, PA-C      . alum & mag hydroxide-simeth (MAALOX/MYLANTA) 200-200-20 MG/5ML suspension 30 mL  30 mL Oral Q6H PRN Sherwood Gambler, MD      . amLODipine (NORVASC) tablet 10 mg  10 mg Oral Daily Jillyn Ledger, PA-C   10 mg at 02/24/17 0919  . cephALEXin (KEFLEX) capsule 500 mg  500 mg Oral Q12H Jillyn Ledger, PA-C   500 mg at 02/24/17 5009  . metoprolol tartrate (LOPRESSOR) tablet 50 mg  50 mg Oral BID Jillyn Ledger, PA-C   50 mg at 02/24/17 0919  . ondansetron (ZOFRAN) tablet 4 mg  4 mg Oral Q8H PRN Maczis, Barth Kirks, PA-C       Current Outpatient Prescriptions  Medication Sig Dispense Refill  . acetaminophen (TYLENOL) 500 MG tablet Take 1,000 mg by mouth every 6 (six) hours as needed for mild pain.    Marland Kitchen aspirin 81 MG chewable tablet Chew 81-162 mg by mouth daily.    . cetirizine (ZYRTEC) 10 MG chewable tablet Chew 10 mg by mouth daily.    . metoprolol (LOPRESSOR)  50 MG tablet Take 1 tablet (50 mg total) by mouth 2 (two) times daily. 180 tablet 4    Musculoskeletal: Strength & Muscle Tone: within normal limits Gait & Station: normal Patient leans: N/A  Psychiatric Specialty Exam: Physical Exam  Constitutional: She appears well-developed and well-nourished.  HENT:  Head: Normocephalic and atraumatic.  Neck: Normal range of motion.  Respiratory: Effort normal.  Musculoskeletal: Normal range of motion.  Neurological: She is alert.  Skin: No rash noted.    Review of Systems  Psychiatric/Behavioral: Negative for depression, hallucinations, substance abuse and suicidal ideas. The patient is not nervous/anxious.     Blood pressure 126/79, pulse 92, temperature 98.3 F (36.8 C), temperature source Oral, resp. rate 20, height _0  (1.575 m), SpO2 99 %.There is no height or weight on file to calculate BMI.  General Appearance: Elderly, African American female with her messy braided hair and a hospital  gown. NAD.   Eye Contact:  Good  Speech:  Normal Rate  Volume:  Normal  Mood:  "Happy"  Affect:  Congruent  Thought Process:  Goal Directed and Linear  Orientation:  Other:  Oriented to self and place.  Thought Content:  She reports talking to her dolls at home but denies paranoia.  Suicidal Thoughts:  No  Homicidal Thoughts:  No  Memory:  Immediate;   Fair Recent;   Fair Remote;   Fair  Judgement:  Fair  Insight:  Fair  Psychomotor Activity:  Normal  Concentration:  Concentration: Fair and Attention Span: Fair  Recall:  [employer] of Knowledge:  Fair  Language:  Good  Akathisia:  No  Handed:  Right  AIMS (if indicated):   N/A  Assets:  Housing Social Support  ADL's:  Intact  Cognition:  WNL  Sleep:   Okay   Assessment: Ms. Sharlett Lienemann presented to [facility] accompanied by her family due to concerns for her safety at home secondary to paranoia. Patient does not exhibit paranoia on interview and is logical and linear in thought process. She does appear to have a UTI and is receiving treatment. Will observe overnight for psychiatric symptoms and reevaluate disposition tomorrow.   Treatment Plan Summary: Schizophrenia: Daily contact with patient to assess and evaluate symptoms and progress in treatment and Medication management  -Patient is not actively psychotic or agitated since admission to the hospital. Will defer starting medication at this time given active UTI for which treatment was treatment was started. Will observe overnight.   Disposition: Overnight observation with potential discharge tomorrow.   Faythe Dingwall, DO 02/24/2017 2:37 PM

## 2017-02-24 NOTE — ED Notes (Signed)
Patient denies SI/HI/AVH at this time. Plan of care discussed. Encouragement and support provided and safety maintain. Q 15 min safety checks remain in place and video monitoring. 

## 2017-02-24 NOTE — ED Notes (Signed)
Patient denies pain and is resting comfortably.  

## 2017-02-25 DIAGNOSIS — F4325 Adjustment disorder with mixed disturbance of emotions and conduct: Secondary | ICD-10-CM | POA: Diagnosis present

## 2017-02-25 DIAGNOSIS — N390 Urinary tract infection, site not specified: Secondary | ICD-10-CM

## 2017-02-25 MED ORDER — CEPHALEXIN 500 MG PO CAPS: 500.0000 mg | ORAL_CAPSULE | Freq: Two times a day (BID) | ORAL | 0 refills | Status: DC

## 2017-02-25 NOTE — ED Notes (Signed)
Patient denies pain and is resting comfortably.  

## 2017-02-25 NOTE — Consult Note (Signed)
[place] [facility] Consult   Reason for Consult:  Confusion and unusual behaviors Referring Physician:  EDP Patient Identification: Pamela Hardin MRN:  [id number] Principal Diagnosis: Urinary tract infection Diagnosis:   Patient Active Problem List   Diagnosis Date Noted  . Adjustment disorder with mixed disturbance of emotions and conduct [F43.25] 02/25/2017    Priority: High  . Urinary tract infection [N39.0] 02/25/2017    Priority: High  . Schizophrenia ([place]) [F20.9] 02/24/2017  . Obesity (BMI 30-39.9) [E66.9] 10/12/2011  . Preventative health care [Z00.00] 08/26/2011  . TOBACCO ABUSE [F17.200] 07/22/2009  . ANEMIA-IRON DEFICIENCY [D50.9] 10/27/2006  . BIPOLAR AFFECTIVE DISORDER [F31.9] 10/27/2006  . HYPERTENSION [I10] 10/27/2006  . FOOT PAIN, CHRONIC [M79.609] 10/27/2006  . GASTROINTESTINAL HEMORRHAGE, HX OF [Z87.19] 10/27/2006  . HYSTERECTOMY, TOTAL, HX OF [Z90.79] 10/27/2006  . DIVERTICULOSIS, COLON, WITH HEMORRHAGE [K57.31] 10/14/2006    Total Time spent with patient: 45 minutes  Subjective:   Pamela Hardin is a 65 y.o. female patient has stabilized.  HPI:  66 yo female who presented to the ED via family for confusion and unusual behaviors.  She was diagnosed with a UTI and treatment in place.  Today, she is clear and coherent with no confusion or abnormal behaviors.  No suicidal/homicidal ideations, hallucinations, or substance abuse.  Her nephew reported that family has not been involved until recently and he believes it is money motivated.  Patient is stable for discharge.  Past Psychiatric History: questionable bipolar d/o  Risk to Self: Is patient at risk for suicide?: No Risk to Others:  No Prior Inpatient Therapy:  NO Prior Outpatient Therapy:  Yes  Past Medical History:  Past Medical History:  Diagnosis Date  . Anemia    iron deficiency  . Bipolar 1 disorder ([place])   . Diverticulosis 2008   sigmoid diverticular bleed in June 2008 s/p  colonoscopy  . HTN (hypertension)   . Tobacco abuse     Past Surgical History:  Procedure Laterality Date  . TOTAL ABDOMINAL HYSTERECTOMY  1984   Family History: History reviewed. No pertinent family history. Family Psychiatric  History: unknown Social History:  History  Alcohol Use  . Yes    Comment: rum     History  Drug Use  . Types: Marijuana    Comment: uses frequently    Social History   Social History  . Marital status: Single    Spouse name: N/A  . Number of children: N/A  . Years of education: N/A   Social History Main Topics  . Smoking status: Current Every Day Smoker  . Smokeless tobacco: Never Used     Comment: 1pack/week  . Alcohol use Yes     Comment: rum  . Drug use: Yes    Types: Marijuana     Comment: uses frequently  . Sexual activity: Not Currently   Other Topics Concern  . 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, alternates between day and night shift   Single   No children   Lives alone   Smokes a pack per week for last 30 years, does not want to quit   Denies alcohol or illicit drug use            Additional Social History:    Allergies:  No Known Allergies  Labs:  Results for orders placed or performed during the hospital encounter of 02/23/17 (  from the past 48 hour(s))  Comprehensive metabolic panel     Status: Abnormal   Collection Time: 02/23/17  3:49 PM  Result Value Ref Range   Sodium 141 135 - 145 mmol/L   Potassium 3.0 (L) 3.5 - 5.1 mmol/L   Chloride 108 101 - 111 mmol/L   CO2 25 22 - 32 mmol/L   Glucose, Bld 86 65 - 99 mg/dL   BUN 10 6 - 20 mg/dL   Creatinine, Ser 0.66 0.44 - 1.00 mg/dL   Calcium 8.9 8.9 - 10.3 mg/dL   Total Protein 7.2 6.5 - 8.1 g/dL   Albumin 3.6 3.5 - 5.0 g/dL   AST 25 15 - 41 U/L   ALT 16 14 - 54 U/L   Alkaline Phosphatase 64 38 - 126 U/L   Total Bilirubin 0.4 0.3 - 1.2 mg/dL   GFR calc non Af Amer >60  >60 mL/min   GFR calc Af Amer >60 >60 mL/min    Comment: (NOTE) The eGFR has been calculated using the CKD EPI equation. This calculation has not been validated in all clinical situations. eGFR's persistently <60 mL/min signify possible Chronic Kidney Disease.    Anion gap 8 5 - 15  CBC with Diff     Status: Abnormal   Collection Time: 02/23/17  3:49 PM  Result Value Ref Range   WBC 4.9 4.0 - 10.5 K/uL   RBC 4.18 3.87 - 5.11 MIL/uL   Hemoglobin 11.3 (L) 12.0 - 15.0 g/dL   HCT 35.2 (L) 36.0 - 46.0 %   MCV 84.2 78.0 - 100.0 fL   MCH 27.0 26.0 - 34.0 pg   MCHC 32.1 30.0 - 36.0 g/dL   RDW 16.3 (H) 11.5 - 15.5 %   Platelets 278 150 - 400 K/uL   Neutrophils Relative % 44 %   Neutro Abs 2.2 1.7 - 7.7 K/uL   Lymphocytes Relative 35 %   Lymphs Abs 1.7 0.7 - 4.0 K/uL   Monocytes Relative 18 %   Monocytes Absolute 0.9 0.1 - 1.0 K/uL   Eosinophils Relative 2 %   Eosinophils Absolute 0.1 0.0 - 0.7 K/uL   Basophils Relative 1 %   Basophils Absolute 0.0 0.0 - 0.1 K/uL  Urinalysis, Routine w reflex microscopic     Status: Abnormal   Collection Time: 02/23/17  4:15 PM  Result Value Ref Range   Color, Urine YELLOW YELLOW   APPearance HAZY (A) CLEAR   Specific Gravity, Urine 1.026 1.005 - 1.030   pH 6.0 5.0 - 8.0   Glucose, UA NEGATIVE NEGATIVE mg/dL   Hgb urine dipstick NEGATIVE NEGATIVE   Bilirubin Urine NEGATIVE NEGATIVE   Ketones, ur NEGATIVE NEGATIVE mg/dL   Protein, ur NEGATIVE NEGATIVE mg/dL   Nitrite POSITIVE (A) NEGATIVE   Leukocytes, UA TRACE (A) NEGATIVE   RBC / HPF 0-5 0 - 5 RBC/hpf   WBC, UA 6-30 0 - 5 WBC/hpf   Bacteria, UA MANY (A) NONE SEEN   Squamous Epithelial / LPF 6-30 (A) NONE SEEN   Mucus PRESENT   Urine rapid drug screen (hosp performed)     Status: Abnormal   Collection Time: 02/23/17  4:15 PM  Result Value Ref Range   Opiates NONE DETECTED NONE DETECTED   Cocaine NONE DETECTED NONE DETECTED   Benzodiazepines NONE DETECTED NONE DETECTED   Amphetamines NONE  DETECTED NONE DETECTED   Tetrahydrocannabinol POSITIVE (A) NONE DETECTED   Barbiturates NONE DETECTED NONE DETECTED    Comment:  DRUG SCREEN FOR MEDICAL PURPOSES ONLY.  IF CONFIRMATION IS NEEDED FOR ANY PURPOSE, NOTIFY LAB WITHIN 5 DAYS.        LOWEST DETECTABLE LIMITS FOR URINE DRUG SCREEN Drug Class       Cutoff (ng/mL) Amphetamine      1000 Barbiturate      200 Benzodiazepine   315 Tricyclics       400 Opiates          300 Cocaine          300 THC              50   Ethanol     Status: None   Collection Time: 02/23/17  4:54 PM  Result Value Ref Range   Alcohol, Ethyl (B) <10 <10 mg/dL    Comment:        LOWEST DETECTABLE LIMIT FOR SERUM ALCOHOL IS 10 mg/dL FOR MEDICAL PURPOSES ONLY   Salicylate level     Status: None   Collection Time: 02/23/17  4:54 PM  Result Value Ref Range   Salicylate Lvl <8.6 2.8 - 30.0 mg/dL  Acetaminophen level     Status: Abnormal   Collection Time: 02/23/17  4:54 PM  Result Value Ref Range   Acetaminophen (Tylenol), Serum <10 (L) 10 - 30 ug/mL    Comment:        THERAPEUTIC CONCENTRATIONS VARY SIGNIFICANTLY. A RANGE OF 10-30 ug/mL MAY BE AN EFFECTIVE CONCENTRATION FOR MANY PATIENTS. HOWEVER, SOME ARE BEST TREATED AT CONCENTRATIONS OUTSIDE THIS RANGE. ACETAMINOPHEN CONCENTRATIONS >150 ug/mL AT 4 HOURS AFTER INGESTION AND >50 ug/mL AT 12 HOURS AFTER INGESTION ARE OFTEN ASSOCIATED WITH TOXIC REACTIONS.     Current Facility-Administered Medications  Medication Dose Route Frequency Provider Last Rate Last Dose  . acetaminophen (TYLENOL) tablet 650 mg  650 mg Oral Q4H PRN Maczis, Barth Kirks, PA-C      . alum & mag hydroxide-simeth (MAALOX/MYLANTA) 200-200-20 MG/5ML suspension 30 mL  30 mL Oral Q6H PRN Sherwood Gambler, MD   30 mL at 02/24/17 1710  . amLODipine (NORVASC) tablet 10 mg  10 mg Oral Daily Jillyn Ledger, PA-C   10 mg at 02/25/17 7619  . cephALEXin (KEFLEX) capsule 500 mg  500 mg Oral Q12H Jillyn Ledger, PA-C    500 mg at 02/25/17 5093  . metoprolol tartrate (LOPRESSOR) tablet 50 mg  50 mg Oral BID Jillyn Ledger, PA-C   50 mg at 02/25/17 2671  . ondansetron (ZOFRAN) tablet 4 mg  4 mg Oral Q8H PRN Maczis, Barth Kirks, PA-C       Current Outpatient Prescriptions  Medication Sig Dispense Refill  . acetaminophen (TYLENOL) 500 MG tablet Take 1,000 mg by mouth every 6 (six) hours as needed for mild pain.    Marland Kitchen aspirin 81 MG chewable tablet Chew 81-162 mg by mouth daily.    . cetirizine (ZYRTEC) 10 MG chewable tablet Chew 10 mg by mouth daily.    . metoprolol (LOPRESSOR) 50 MG tablet Take 1 tablet (50 mg total) by mouth 2 (two) times daily. 180 tablet 4    Musculoskeletal: Strength & Muscle Tone: within normal limits Gait & Station: normal Patient leans: N/A  Psychiatric Specialty Exam: Physical Exam  Constitutional: She is oriented to person, place, and time. She appears well-developed and well-nourished.  HENT:  Head: Normocephalic.  Neck: Normal range of motion.  Respiratory: Effort normal.  Musculoskeletal: Normal range of motion.  Neurological: She is alert and oriented to person, place,  and time.  Psychiatric: She has a normal mood and affect. Her speech is normal and behavior is normal. Judgment and thought content normal. Cognition and memory are normal.    Review of Systems  All other systems reviewed and are negative.   Blood pressure (!) 144/77, pulse 78, temperature 98.1 F (36.7 C), temperature source Oral, resp. rate 18, height _0  (1.575 m), SpO2 97 %.There is no height or weight on file to calculate BMI.  General Appearance: Casual  Eye Contact:  Good  Speech:  Normal Rate  Volume:  Normal  Mood:  Euthymic  Affect:  Congruent  Thought Process:  Coherent and Descriptions of Associations: Intact  Orientation:  Full (Time, Place, and Person)  Thought Content:  WDL and Logical  Suicidal Thoughts:  No  Homicidal Thoughts:  No  Memory:  Immediate;   Good Recent;    Good Remote;   Good  Judgement:  Fair  Insight:  Fair  Psychomotor Activity:  Normal  Concentration:  Concentration: Good and Attention Span: Good  Recall:  Good  Fund of Knowledge:  Good  Language:  Good  Akathisia:  No  Handed:  Right  AIMS (if indicated):     Assets:  Housing Leisure Time Physical Health Resilience Social Support  ADL's:  Intact  Cognition:  WNL  Sleep:        Treatment Plan Summary: Daily contact with patient to assess and evaluate symptoms and progress in treatment, Medication management and Plan adjustment disorder with mixed disturbance of emotions and conduct:  -Crisis stabilization -Medication management:  Continued medical medications -Individual counseling  Disposition: No evidence of imminent risk to self or others at present.    Waylan Boga, NP 02/25/2017 11:14 AM   Patient seen face-to-face for psychiatric evaluation, chart reviewed and case discussed with the physician extender and developed treatment plan. Reviewed the information documented and agree with the treatment plan.  Buford Dresser, DO

## 2017-02-25 NOTE — BHH Suicide Risk Assessment (Signed)
Suicide Risk Assessment  Discharge Assessment   [facility]   Principal Problem: Urinary tract infection Discharge Diagnoses:  Patient Active Problem List   Diagnosis Date Noted  . Adjustment disorder with mixed disturbance of emotions and conduct [F43.25] 02/25/2017    Priority: High  . Urinary tract infection [N39.0] 02/25/2017    Priority: High  . Schizophrenia (HCC) [F20.9] 02/24/2017  . Obesity (BMI 30-39.9) [E66.9] 10/12/2011  . Preventative health care [Z00.00] 08/26/2011  . TOBACCO ABUSE [F17.200] 07/22/2009  . ANEMIA-IRON DEFICIENCY [D50.9] 10/27/2006  . BIPOLAR AFFECTIVE DISORDER [F31.9] 10/27/2006  . HYPERTENSION [I10] 10/27/2006  . FOOT PAIN, CHRONIC [M79.609] 10/27/2006  . GASTROINTESTINAL HEMORRHAGE, HX OF [Z87.19] 10/27/2006  . HYSTERECTOMY, TOTAL, HX OF [Z90.79] 10/27/2006  . DIVERTICULOSIS, COLON, WITH HEMORRHAGE [K57.31] 10/14/2006    Total Time spent with patient: 30 minutes  Musculoskeletal: Strength & Muscle Tone: within normal limits Gait & Station: normal Patient leans: N/A  Psychiatric Specialty Exam: Physical Exam  Constitutional: She is oriented to person, place, and time. She appears well-developed and well-nourished.  HENT:  Head: Normocephalic.  Neck: Normal range of motion.  Respiratory: Effort normal.  Musculoskeletal: Normal range of motion.  Neurological: She is alert and oriented to person, place, and time.  Psychiatric: She has a normal mood and affect. Her speech is normal and behavior is normal. Judgment and thought content normal. Cognition and memory are normal.    Review of Systems  All other systems reviewed and are negative.   Blood pressure (!) 144/77, pulse 78, temperature 98.1 F (36.7 C), temperature source Oral, resp. rate 18, height 5\' 2"  (1.575 m), SpO2 97 %.There is no height or weight on file to calculate BMI.  General Appearance: Casual  Eye Contact:  Good  Speech:  Normal Rate  Volume:   Normal  Mood:  Euthymic  Affect:  Congruent  Thought Process:  Coherent and Descriptions of Associations: Intact  Orientation:  Full (Time, Place, and Person)  Thought Content:  WDL and Logical  Suicidal Thoughts:  No  Homicidal Thoughts:  No  Memory:  Immediate;   Good Recent;   Good Remote;   Good  Judgement:  Fair  Insight:  Fair  Psychomotor Activity:  Normal  Concentration:  Concentration: Good and Attention Span: Good  Recall:  Good  Fund of Knowledge:  Good  Language:  Good  Akathisia:  No  Handed:  Right  AIMS (if indicated):     Assets:  Housing Leisure Time Physical Health Resilience Social Support  ADL's:  Intact  Cognition:  WNL  Sleep:       Mental Status Per Nursing Assessment::   On Admission:   abnormal behaivors  Demographic Factors:  Age 89 or older and Living alone  Loss Factors: NA  Historical Factors: NA  Risk Reduction Factors:   Sense of responsibility to family and Positive social support  Continued Clinical Symptoms:  None  Cognitive Features That Contribute To Risk:  None    Suicide Risk:  Minimal: No identifiable suicidal ideation.  Patients presenting with no risk factors but with morbid ruminations; may be classified as minimal risk based on the severity of the depressive symptoms    Plan Of Care/Follow-up recommendations:  Activity:  as tolerated Diet:  heart healthy diet  Tamu Golz, NP 02/25/2017, 11:17 AM

## 2017-02-25 NOTE — Discharge Instructions (Signed)
For your behavioral health needs, you are advised to follow up with an outpatient therapist.  Contact one of the following practices at your earliest opportunity to ask about scheduling an appointment with a therapist:       [facility]      [address]      [place], [place] [address]      [phone]) [phone]       [facility]      [address]      [place], [place] [address]      [phone]) [phone]       [facility]      [address]      [place], [place] [address]      [phone]) [phone]

## 2017-02-25 NOTE — BH Assessment (Signed)
[facility]  Per Pamela BeetsJacqueline Norman, DO, this pt does not require psychiatric hospitalization at this time.  [facility] to follow up with an outpatient therapist.  This writer spoke to pt and asked if she would like for me to schedule an appointment for her.  She reports that she would prefer to initiate contact on her own.  Discharge instructions include referral information for several outpatient practices for pt to follow up at her own initiative.  Pt's nurse, Angelique BlonderDenise, has been notified.  Pamela Canninghomas Koston Hennes, MA Triage Specialist [phone]

## 2017-02-25 NOTE — Clinical Social Work Note (Signed)
LCSWA has reviewed and discussed interventions used with Social Work Theatre manager.   Kingsley Spittle, [place] [facility] Clinical Social Worker   Clinical Social Work Assessment  Patient Details  Name: Pamela Hardin MRN: [id number] Date of Birth: [birth year]  Date of referral:  02/25/17               Reason for consult:  Family Concerns                Permission sought to share information with:    Permission granted to share information::     Name::        Agency::     Relationship::     Contact Information:     Housing/Transportation Living arrangements for the past 2 months:  Single Family Home Source of Information:  Patient Patient Interpreter Needed:  None Criminal Activity/Legal Involvement Pertinent to Current Situation/Hospitalization:    Significant Relationships:  Other Family Members Lives with:  Self Do you feel safe going back to the place where you live?  Yes Need for family participation in patient care:     Care giving concerns:  Patient admitted to hospital from [facility] and was sent here for medical clearance. Per patients family, they have concerns for patients safety as well as financial concerns living alone.   Social Worker assessment / plan:  CSW intern met with patient via bedside to discuss living arrangements once discharged. CSW intern expressed to patient that patients family members have some concerns about her living arrangements and finances. However, patient expressed to writer that she feels safe going back to her home once discharged. Patient also told CSW intern that she is fully capable of paying her bills on her own and wishes to keep her information private from her family members.  CSW intern informed patient of HIPPA and we will certainly keep her information private because that is her right. Patient has a niece in place to transport her back home once discharged.  Employment status:  Retired Nurse, adult PT  Recommendations:  Not assessed at this time Information / Referral to community resources:     Patient/Family's Response to care:  Patient was very understanding and receptive to care being provided. Patient was also exceptionally friendly and positive during our conversation.  Patient/Family's Understanding of and Emotional Response to Diagnosis, Current Treatment, and Prognosis:  Patient was receptive to the conversation and proactive about her current treatment. Patient was extremely motivated and aware of her functional abilities.  Emotional Assessment Appearance:  Appears stated age Attitude/Demeanor/Rapport:    Affect (typically observed):  Calm, Happy, Accepting Orientation:  Oriented to Self, Oriented to Place, Oriented to  Time, Oriented to Situation Alcohol / Substance use:    Psych involvement (Current and /or in the community):  Yes (Comment)  Discharge Needs  Concerns to be addressed:  No discharge needs identified Readmission within the last 30 days:  No Current discharge risk:  None Barriers to Discharge:  No Barriers Identified   Willeen Niece, Student-Social Work 02/25/2017, 12:10 PM

## 2017-06-08 ENCOUNTER — Other Ambulatory Visit: Payer: Self-pay

## 2017-06-08 ENCOUNTER — Emergency Department (HOSPITAL_COMMUNITY): Payer: Medicare PPO

## 2017-06-08 ENCOUNTER — Encounter (HOSPITAL_COMMUNITY): Payer: Self-pay | Admitting: Emergency Medicine

## 2017-06-08 ENCOUNTER — Emergency Department (HOSPITAL_COMMUNITY)
Admission: EM | Admit: 2017-06-08 | Discharge: 2017-06-09 | Disposition: A | Discharge status: Other Acute Inpatient Hospital | Payer: Medicare PPO | Attending: Emergency Medicine | Admitting: Emergency Medicine

## 2017-06-08 DIAGNOSIS — I10 Essential (primary) hypertension: Secondary | ICD-10-CM | POA: Diagnosis not present

## 2017-06-08 DIAGNOSIS — F17210 Nicotine dependence, cigarettes, uncomplicated: Secondary | ICD-10-CM | POA: Insufficient documentation

## 2017-06-08 DIAGNOSIS — Z008 Encounter for other general examination: Secondary | ICD-10-CM

## 2017-06-08 DIAGNOSIS — R4689 Other symptoms and signs involving appearance and behavior: Secondary | ICD-10-CM | POA: Diagnosis present

## 2017-06-08 DIAGNOSIS — F1210 Cannabis abuse, uncomplicated: Secondary | ICD-10-CM | POA: Insufficient documentation

## 2017-06-08 DIAGNOSIS — F200 Paranoid schizophrenia: Secondary | ICD-10-CM | POA: Insufficient documentation

## 2017-06-08 LAB — URINALYSIS, ROUTINE W REFLEX MICROSCOPIC
BILIRUBIN URINE: NEGATIVE
GLUCOSE, UA: NEGATIVE mg/dL
HGB URINE DIPSTICK: NEGATIVE
Ketones, ur: 5 mg/dL — AB
Leukocytes, UA: NEGATIVE
Nitrite: NEGATIVE
PH: 7 (ref 5.0–8.0)
Protein, ur: NEGATIVE mg/dL
SPECIFIC GRAVITY, URINE: 1.01 (ref 1.005–1.030)

## 2017-06-08 LAB — CBC WITH DIFFERENTIAL/PLATELET
Basophils Absolute: 0.1 10*3/uL (ref 0.0–0.1)
Basophils Relative: 1 %
EOS ABS: 0.1 10*3/uL (ref 0.0–0.7)
Eosinophils Relative: 1 %
HCT: 42.4 % (ref 36.0–46.0)
HEMOGLOBIN: 14.1 g/dL (ref 12.0–15.0)
LYMPHS ABS: 1.5 10*3/uL (ref 0.7–4.0)
Lymphocytes Relative: 22 %
MCH: 28.3 pg (ref 26.0–34.0)
MCHC: 33.3 g/dL (ref 30.0–36.0)
MCV: 85 fL (ref 78.0–100.0)
MONOS PCT: 10 %
Monocytes Absolute: 0.7 10*3/uL (ref 0.1–1.0)
NEUTROS PCT: 66 %
Neutro Abs: 4.6 10*3/uL (ref 1.7–7.7)
Platelets: 282 10*3/uL (ref 150–400)
RBC: 4.99 MIL/uL (ref 3.87–5.11)
RDW: 16 % — ABNORMAL HIGH (ref 11.5–15.5)
WBC: 6.9 10*3/uL (ref 4.0–10.5)

## 2017-06-08 LAB — COMPREHENSIVE METABOLIC PANEL
ALBUMIN: 3.8 g/dL (ref 3.5–5.0)
ALT: 13 U/L — ABNORMAL LOW (ref 14–54)
ANION GAP: 11 (ref 5–15)
AST: 18 U/L (ref 15–41)
Alkaline Phosphatase: 77 U/L (ref 38–126)
BUN: 10 mg/dL (ref 6–20)
CO2: 22 mmol/L (ref 22–32)
Calcium: 9.4 mg/dL (ref 8.9–10.3)
Chloride: 107 mmol/L (ref 101–111)
Creatinine, Ser: 0.72 mg/dL (ref 0.44–1.00)
GFR calc Af Amer: >60 mL/min (ref >60)
GFR calc non Af Amer: >60 mL/min (ref >60)
GLUCOSE: 125 mg/dL — AB (ref 65–99)
POTASSIUM: 3.5 mmol/L (ref 3.5–5.1)
SODIUM: 140 mmol/L (ref 135–145)
TOTAL PROTEIN: 8.3 g/dL — AB (ref 6.5–8.1)
Total Bilirubin: 0.4 mg/dL (ref 0.3–1.2)

## 2017-06-08 LAB — RAPID URINE DRUG SCREEN, HOSP PERFORMED
AMPHETAMINES: NOT DETECTED
Barbiturates: NOT DETECTED
Benzodiazepines: NOT DETECTED
Cocaine: NOT DETECTED
OPIATES: NOT DETECTED
Tetrahydrocannabinol: NOT DETECTED

## 2017-06-08 LAB — CBG MONITORING, ED: GLUCOSE-CAPILLARY: 121 mg/dL — AB (ref 65–99)

## 2017-06-08 LAB — ETHANOL

## 2017-06-08 LAB — ACETAMINOPHEN LEVEL: Acetaminophen (Tylenol), Serum: <10 ug/mL — ABNORMAL LOW (ref 10–30)

## 2017-06-08 LAB — SALICYLATE LEVEL: Salicylate Lvl: <7 mg/dL (ref 2.8–30.0)

## 2017-06-08 MED ORDER — ALUM & MAG HYDROXIDE-SIMETH 200-200-20 MG/5ML PO SUSP: 30.0000 mL | Freq: Four times a day (QID) | ORAL | Status: DC | PRN

## 2017-06-08 MED ORDER — STERILE WATER FOR INJECTION IJ SOLN
INTRAMUSCULAR | Status: AC
  Filled 2017-06-08: qty 10

## 2017-06-08 MED ORDER — ACETAMINOPHEN 325 MG PO TABS
650.0000 mg | ORAL_TABLET | ORAL | Status: DC | PRN
  Administered 2017-06-08: 650 mg via ORAL
  Filled 2017-06-08: qty 2

## 2017-06-08 MED ORDER — RISPERIDONE 1 MG PO TABS
1.0000 mg | ORAL_TABLET | Freq: Two times a day (BID) | ORAL | Status: DC
  Filled 2017-06-08 (×2): qty 1

## 2017-06-08 MED ORDER — ONDANSETRON HCL 4 MG PO TABS: 4.0000 mg | ORAL_TABLET | Freq: Three times a day (TID) | ORAL | Status: DC | PRN

## 2017-06-08 MED ORDER — DIPHENHYDRAMINE HCL 50 MG/ML IJ SOLN
50.0000 mg | Freq: Once | INTRAMUSCULAR | Status: AC
  Administered 2017-06-08: 50 mg via INTRAMUSCULAR
  Filled 2017-06-08: qty 1

## 2017-06-08 MED ORDER — ZIPRASIDONE MESYLATE 20 MG IM SOLR
10.0000 mg | Freq: Once | INTRAMUSCULAR | Status: AC
  Administered 2017-06-08: 10 mg via INTRAMUSCULAR
  Filled 2017-06-08: qty 20

## 2017-06-08 MED ORDER — LORAZEPAM 2 MG/ML IJ SOLN
1.0000 mg | Freq: Once | INTRAMUSCULAR | Status: AC
  Administered 2017-06-08: 1 mg via INTRAMUSCULAR
  Filled 2017-06-08: qty 1

## 2017-06-08 MED ORDER — ZIPRASIDONE MESYLATE 20 MG IM SOLR: 20.0000 mg | Freq: Two times a day (BID) | INTRAMUSCULAR | Status: DC | PRN

## 2017-06-08 MED ORDER — HALOPERIDOL LACTATE 5 MG/ML IJ SOLN: 2.0000 mg | Freq: Once | INTRAMUSCULAR | Status: DC

## 2017-06-08 NOTE — Progress Notes (Signed)
This patient continues to meet inpatient criteria. CSW fax information to the following facilities:   [facility]  [facility]  [facility]  [facility]  [facility]  [facility]  Irianna Gilday, [place] Emergency Room Clinical Social Worker [phone]

## 2017-06-08 NOTE — ED Provider Notes (Signed)
[facility]-EMERGENCY DEPT Provider Note   CSN: [id number] Arrival date & time:        History   Chief Complaint Chief Complaint  Patient presents with  . Medical Clearance    HPI Pamela Hardin is a 67 y.o. female.  The history is provided by the patient and the EMS personnel.  Mental Health Problem  Presenting symptoms: aggressive behavior and agitation   Presenting symptoms: no depression and no self-mutilation   Patient accompanied by: none. Degree of incapacity (severity):  Severe Onset quality:  Unable to specify Timing:  Constant Progression:  Unchanged Chronicity:  New Context: not noncompliant   Treatment compliance:  Untreated Relieved by:  Nothing Ineffective treatments:  None tried Associated symptoms: no abdominal pain, no anxiety, no chest pain and no feelings of worthlessness   Risk factors: hx of mental illness     Past Medical History:  Diagnosis Date  . Anemia    iron deficiency  . Bipolar 1 disorder (HCC)   . Diverticulosis 2008   sigmoid diverticular bleed in June 2008 s/p colonoscopy  . HTN (hypertension)   . Tobacco abuse     Patient Active Problem List   Diagnosis Date Noted  . Adjustment disorder with mixed disturbance of emotions and conduct 02/25/2017  . Urinary tract infection 02/25/2017  . Schizophrenia (HCC) 02/24/2017  . Obesity (BMI 30-39.9) 10/12/2011  . Preventative health care 08/26/2011  . TOBACCO ABUSE 07/22/2009  . ANEMIA-IRON DEFICIENCY 10/27/2006  . BIPOLAR AFFECTIVE DISORDER 10/27/2006  . HYPERTENSION 10/27/2006  . FOOT PAIN, CHRONIC 10/27/2006  . GASTROINTESTINAL HEMORRHAGE, HX OF 10/27/2006  . HYSTERECTOMY, TOTAL, HX OF 10/27/2006  . DIVERTICULOSIS, COLON, WITH HEMORRHAGE 10/14/2006    Past Surgical History:  Procedure Laterality Date  . TOTAL ABDOMINAL HYSTERECTOMY  1984    OB History    No data available       Home Medications    Prior to Admission medications     Medication Sig Start Date End Date Taking? Authorizing Provider  acetaminophen (TYLENOL) 500 MG tablet Take 1,000 mg by mouth every 6 (six) hours as needed for mild pain.    [provider]  aspirin 81 MG chewable tablet Chew 81-162 mg by mouth daily.    [provider]  cephALEXin (KEFLEX) 500 MG capsule Take 1 capsule (500 mg total) by mouth every 12 (twelve) hours. 02/25/17   Charm Rings, NP  cetirizine (ZYRTEC) 10 MG chewable tablet Chew 10 mg by mouth daily.    [provider]  metoprolol (LOPRESSOR) 50 MG tablet Take 1 tablet (50 mg total) by mouth 2 (two) times daily. 10/12/11 10/11/12  RaischKayleen Memos, MD    Family History History reviewed. No pertinent family history.  Social History Social History   Tobacco Use  . Smoking status: Current Every Day Smoker  . Smokeless tobacco: Never Used  . Tobacco comment: 1pack/week  Substance Use Topics  . Alcohol use: Yes    Comment: rum  . Drug use: Yes    Types: Marijuana    Comment: uses frequently     Allergies   Patient has no known allergies.   Review of Systems Review of Systems  Constitutional: Negative for fever.  Respiratory: Negative for shortness of breath.   Cardiovascular: Negative for chest pain.  Gastrointestinal: Negative for abdominal pain.  Psychiatric/Behavioral: Positive for agitation. Negative for confusion, dysphoric mood and self-injury. The patient is not nervous/anxious.   All other systems reviewed and are  negative.    Physical Exam Updated Vital Signs There were no vitals taken for this visit.  Physical Exam  Constitutional: She appears well-developed and well-nourished. No distress.  HENT:  Head: Normocephalic and atraumatic.  Nose: Nose normal.  Mouth/Throat: No oropharyngeal exudate.  Eyes: Conjunctivae are normal. Pupils are equal, round, and reactive to light.  Neck: Normal range of motion. Neck supple. No JVD present. No tracheal deviation present.   Cardiovascular: Normal rate, regular rhythm, normal heart sounds and intact distal pulses.  Pulmonary/Chest: Effort normal and breath sounds normal. No stridor. She has no wheezes. She has no rales.  Abdominal: Soft. Bowel sounds are normal. She exhibits no mass. There is no tenderness. There is no rebound and no guarding.  Musculoskeletal: Normal range of motion. She exhibits no edema, tenderness or deformity.  Neurological: She is alert. She displays normal reflexes.  Skin: Skin is warm and dry. Capillary refill takes less than 2 seconds.  Psychiatric: Her speech is tangential. She is communicative.  Rapid and pressured     ED Treatments / Results  Labs (all labs ordered are listed, but only abnormal results are displayed)  Results for orders placed or performed during the hospital encounter of 06/08/17  Comprehensive metabolic panel  Result Value Ref Range   Sodium 140 135 - 145 mmol/L   Potassium 3.5 3.5 - 5.1 mmol/L   Chloride 107 101 - 111 mmol/L   CO2 22 22 - 32 mmol/L   Glucose, Bld 125 (H) 65 - 99 mg/dL   BUN 10 6 - 20 mg/dL   Creatinine, Ser 1.610.72 0.44 - 1.00 mg/dL   Calcium 9.4 8.9 - 09.610.3 mg/dL   Total Protein 8.3 (H) 6.5 - 8.1 g/dL   Albumin 3.8 3.5 - 5.0 g/dL   AST 18 15 - 41 U/L   ALT 13 (L) 14 - 54 U/L   Alkaline Phosphatase 77 38 - 126 U/L   Total Bilirubin 0.4 0.3 - 1.2 mg/dL   GFR calc non Af Amer >60 >60 mL/min   GFR calc Af Amer >60 >60 mL/min   Anion gap 11 5 - 15  Urine rapid drug screen (hosp performed)  Result Value Ref Range   Opiates NONE DETECTED NONE DETECTED   Cocaine NONE DETECTED NONE DETECTED   Benzodiazepines NONE DETECTED NONE DETECTED   Amphetamines NONE DETECTED NONE DETECTED   Tetrahydrocannabinol NONE DETECTED NONE DETECTED   Barbiturates NONE DETECTED NONE DETECTED  CBC WITH DIFFERENTIAL  Result Value Ref Range   WBC 6.9 4.0 - 10.5 K/uL   RBC 4.99 3.87 - 5.11 MIL/uL   Hemoglobin 14.1 12.0 - 15.0 g/dL   HCT 04.542.4 40.936.0 - 81.146.0 %    MCV 85.0 78.0 - 100.0 fL   MCH 28.3 26.0 - 34.0 pg   MCHC 33.3 30.0 - 36.0 g/dL   RDW 91.416.0 (H) 78.211.5 - 95.615.5 %   Platelets 282 150 - 400 K/uL   Neutrophils Relative % 66 %   Neutro Abs 4.6 1.7 - 7.7 K/uL   Lymphocytes Relative 22 %   Lymphs Abs 1.5 0.7 - 4.0 K/uL   Monocytes Relative 10 %   Monocytes Absolute 0.7 0.1 - 1.0 K/uL   Eosinophils Relative 1 %   Eosinophils Absolute 0.1 0.0 - 0.7 K/uL   Basophils Relative 1 %   Basophils Absolute 0.1 0.0 - 0.1 K/uL  CBG monitoring, ED  Result Value Ref Range   Glucose-Capillary 121 (H) 65 - 99 mg/dL   No  results found.  EKG  EKG Interpretation  Date/Time:  Tuesday June 08 2017 05:11:21 EST Ventricular Rate:  108 PR Interval:  158 QRS Duration: 72 QT Interval:  354 QTC Calculation: 474 R Axis:   -30 Text Interpretation:  Sinus tachycardia Possible Left atrial enlargement Left axis deviation Confirmed by Valeri Sula ([id number]) on 06/08/2017 5:23:26 AM       Haldol one dose given for agitation.  Patient is now resting comfortably   Procedures Procedures (including critical care time)  Medications Ordered in ED  Medications  haloperidol lactate (HALDOL) injection 2 mg (0 mg Intramuscular Hold 06/08/17 0537)     Final Clinical Impressions(s) / ED Diagnoses   Medically cleared by me for psychiatry.  Holding orders placed.      Neng Albee, MD 06/08/17 [phone]

## 2017-06-08 NOTE — ED Notes (Signed)
Cleotis NipperJames Dauphinais [place](Nephew) [phone]

## 2017-06-08 NOTE — ED Notes (Signed)
Gave report to cynthia RN 
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
## 2017-06-08 NOTE — ED Notes (Signed)
Pt arrived to unit pleasant and cooperative with her dinner tray.  Pt was a bit confused on time but was oriented to person and place.  Pt denies S/I and H/I and AVH.  15 minute checks and video monitoring in place.
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
## 2017-06-08 NOTE — ED Notes (Signed)
Nurse practitioner notified that patient was slamming doors, talking loud and screaming intermittently in the unit.  IM medications ordered.

## 2017-06-08 NOTE — ED Notes (Signed)
Patient taking a shower.

## 2017-06-08 NOTE — Progress Notes (Signed)
CSW [place] from [place] [facility] Number for report is: [phone] Pt's unit/room/bed number will be: Acute Unit Accepting physician: Dr. Wendy PoetJack Wang  Pt can arrive ASAP on 2/13 after 10am  CSW will update RN and EDP.  Pt is IVC'd, per RN.  Dorothe PeaJonathan F. Osiel Stick, LCSW, LCAS, CSI Clinical Social Worker Ph: [phone]

## 2017-06-08 NOTE — ED Notes (Signed)
Patient is calm and cooperative at this time. Encouragement and support provided and safety maintain. Q 15 min safety checks remain in place and video monitoring.

## 2017-06-08 NOTE — ED Notes (Signed)
Patient placed in paper scrubs and wanded by security. Patient's belongings in locker 28.

## 2017-06-08 NOTE — ED Notes (Signed)
Patient refused PO medication

## 2017-06-09 ENCOUNTER — Encounter (HOSPITAL_COMMUNITY): Payer: Self-pay | Admitting: Emergency Medicine

## 2017-06-09 NOTE — ED Notes (Signed)
[facility]

## 2017-10-31 ENCOUNTER — Emergency Department (HOSPITAL_COMMUNITY)
Admission: EM | Admit: 2017-10-31 | Discharge: 2017-10-31 | Disposition: A | Discharge status: Home / Self Care | Payer: Medicare PPO | Attending: Emergency Medicine | Admitting: Emergency Medicine

## 2017-10-31 ENCOUNTER — Encounter (HOSPITAL_COMMUNITY): Payer: Self-pay | Admitting: Emergency Medicine

## 2017-10-31 ENCOUNTER — Other Ambulatory Visit: Payer: Self-pay

## 2017-10-31 DIAGNOSIS — F17200 Nicotine dependence, unspecified, uncomplicated: Secondary | ICD-10-CM | POA: Insufficient documentation

## 2017-10-31 DIAGNOSIS — Z79899 Other long term (current) drug therapy: Secondary | ICD-10-CM | POA: Diagnosis not present

## 2017-10-31 DIAGNOSIS — Z7982 Long term (current) use of aspirin: Secondary | ICD-10-CM | POA: Insufficient documentation

## 2017-10-31 DIAGNOSIS — J029 Acute pharyngitis, unspecified: Secondary | ICD-10-CM | POA: Insufficient documentation

## 2017-10-31 DIAGNOSIS — I10 Essential (primary) hypertension: Secondary | ICD-10-CM | POA: Diagnosis not present

## 2017-10-31 MED ORDER — METOPROLOL TARTRATE 50 MG PO TABS: 50.0000 mg | ORAL_TABLET | Freq: Two times a day (BID) | ORAL | 0 refills | Status: DC

## 2017-10-31 MED ORDER — AMOXICILLIN-POT CLAVULANATE 875-125 MG PO TABS: 1.0000 | ORAL_TABLET | Freq: Two times a day (BID) | ORAL | 0 refills | Status: DC

## 2017-10-31 NOTE — ED Notes (Signed)
Pt using phone to call for ride

## 2017-10-31 NOTE — ED Provider Notes (Signed)
[facility] EMERGENCY DEPARTMENT Provider Note   CSN: [id number] Arrival date & time: 10/31/17  1010     History   Chief Complaint Chief Complaint  Patient presents with  . Laryngitis  . Cough    HPI Pamela Hardin is a 67 y.o. female.  HPI   Pamela Hardin is a 67 y.o. female, with a history of iron deficiency anemia, bipolar, and HTN, presenting to the ED with throat pain and hoarseness for the past 2 months.  States symptoms will wax and wane, but will not completely resolved.  Pain is described as an irritation, mild to moderate, bilateral, nonradiating. States she has an intermittent cough due to irritation in her throat.  Tried taking Tylenol without improvement.  Denies recent antibiotic use.  Also complains of intermittent bilateral ear pain. Denies fever/chills, nausea/vomiting, shortness of breath, ear drainage, hearing loss, difficulty swallowing, drooling, chest pain, swelling or foreign body sensation, or any other complaints.      Past Medical History:  Diagnosis Date  . Anemia    iron deficiency  . Bipolar 1 disorder (HCC)   . Diverticulosis 2008   sigmoid diverticular bleed in June 2008 s/p colonoscopy  . HTN (hypertension)   . Tobacco abuse     Patient Active Problem List   Diagnosis Date Noted  . Adjustment disorder with mixed disturbance of emotions and conduct 02/25/2017  . Urinary tract infection 02/25/2017  . Schizophrenia (HCC) 02/24/2017  . Obesity (BMI 30-39.9) 10/12/2011  . Preventative health care 08/26/2011  . TOBACCO ABUSE 07/22/2009  . ANEMIA-IRON DEFICIENCY 10/27/2006  . BIPOLAR AFFECTIVE DISORDER 10/27/2006  . HYPERTENSION 10/27/2006  . FOOT PAIN, CHRONIC 10/27/2006  . GASTROINTESTINAL HEMORRHAGE, HX OF 10/27/2006  . HYSTERECTOMY, TOTAL, HX OF 10/27/2006  . DIVERTICULOSIS, COLON, WITH HEMORRHAGE 10/14/2006    Past Surgical History:  Procedure Laterality Date  . TOTAL ABDOMINAL HYSTERECTOMY  1984      OB History   None      Home Medications    Prior to Admission medications   Medication Sig Start Date End Date Taking? Authorizing Provider  acetaminophen (TYLENOL) 500 MG tablet Take 500 mg by mouth every 6 (six) hours as needed for mild pain.    Yes [provider]  aspirin 81 MG chewable tablet Chew 81 mg by mouth daily.    Yes [provider]  amoxicillin-clavulanate (AUGMENTIN) 875-125 MG tablet Take 1 tablet by mouth every 12 (twelve) hours. 10/31/17   Joy, Shawn C, PA-C  benztropine (COGENTIN) 1 MG tablet Take 1 mg by mouth 2 (two) times daily.    [provider]  cephALEXin (KEFLEX) 500 MG capsule Take 1 capsule (500 mg total) by mouth every 12 (twelve) hours. Patient not taking: Reported on 06/08/2017 02/25/17   Charm Rings, NP  metoprolol tartrate (LOPRESSOR) 50 MG tablet Take 50 mg by mouth 2 (two) times daily. 08/27/17   [provider]  metoprolol tartrate (LOPRESSOR) 50 MG tablet Take 1 tablet (50 mg total) by mouth 2 (two) times daily. 10/31/17 11/30/17  Joy, Shawn C, PA-C  risperiDONE (RISPERDAL) 1 MG tablet Take 1-3 mg by mouth See admin instructions. Take 1 mg in the morning and 3 mg in the evening    [provider]    Family History No family history on file.  Social History Social History   Tobacco Use  . Smoking status: Current Every Day Smoker  . Smokeless tobacco: Never Used  . Tobacco comment:  1pack/week  Substance Use Topics  . Alcohol use: Yes    Comment: rum  . Drug use: Yes    Types: Marijuana    Comment: uses frequently     Allergies   Patient has no known allergies.   Review of Systems Review of Systems  Constitutional: Negative for fever.  HENT: Positive for ear pain, sore throat and voice change. Negative for drooling and trouble swallowing.   Respiratory: Positive for cough. Negative for shortness of breath.   All other systems reviewed and are negative.    Physical Exam Updated Vital  Signs BP (!) 160/101 (BP Location: Right Arm)   Pulse 89   Temp 98.4 F (36.9 C) (Oral)   Resp 20   SpO2 98%   Physical Exam  Constitutional: She appears well-developed and well-nourished. No distress.  HENT:  Head: Normocephalic and atraumatic.  Mouth/Throat: Oropharynx is clear and moist.  Some mild hoarseness to the patient's voice.  No noted area of swelling or fluctuance.  No trismus.  Mouth opening to at least 3 finger widths.  Handles oral secretions without difficulty.  No noted facial swelling.  No swelling or tenderness to the submental or submandibular regions.  No swelling or tenderness into the soft tissues of the neck.  Eyes: Pupils are equal, round, and reactive to light. Conjunctivae and EOM are normal.  Neck: Normal range of motion. Neck supple. No thyromegaly present.  Cardiovascular: Normal rate, regular rhythm, normal heart sounds and intact distal pulses.  Pulmonary/Chest: Effort normal and breath sounds normal. No respiratory distress.  No increased work of breathing.  Speaks in full sentences without difficulty.  Abdominal: Soft. There is no tenderness. There is no guarding.  Musculoskeletal: She exhibits no edema.  Lymphadenopathy:    She has no cervical adenopathy.  Neurological: She is alert.  Sensation grossly intact to light touch in all four extremities. Strength 5/5 in all extremities. No gait disturbance. Coordination intact. Cranial nerves III-XII grossly intact. No facial droop.   Skin: Skin is warm and dry. She is not diaphoretic.  Psychiatric: She has a normal mood and affect. Her behavior is normal.  Nursing note and vitals reviewed.    ED Treatments / Results  Labs (all labs ordered are listed, but only abnormal results are displayed) Labs Reviewed - No data to display  EKG None  Radiology No results found.  Procedures Procedures (including critical care time)  Medications Ordered in ED Medications - No data to display   Initial  Impression / Assessment and Plan / ED Course  I have reviewed the triage vital signs and the nursing notes.  Pertinent labs & imaging results that were available during my care of the patient were reviewed by me and considered in my medical decision making (see chart for details).  Clinical Course as of Nov 01 1238  Wynelle LinkSun Oct 31, 2017  1056 Patient was previously prescribed metoprolol 50 mg twice daily, however, has been out for the past 2 weeks.  She is asymptomatic to her hypertension at this time.  BP(!): 160/101 [SJ]    Clinical Course User Index [SJ] Joy, Shawn C, PA-C    Patient presents with persistent hoarseness and throat discomfort for the past 2 months. Patient handles oral secretions without noted difficulty.  She has no noted swallowing dysfunction, no indication of neurologic dysfunction on physical exam, no GERD symptoms.  I do not think patient has an acute issue for which emergent intervention is necessary.  We will try  a course of antibiotics and have patient follow-up with ENT. The patient was given instructions for home care as well as return precautions. Patient voices understanding of these instructions, accepts the plan, and is comfortable with discharge.   Patient was diagnosed with HTN during an inpatient stay at [facility], a psychiatric facility about a year ago. She was given prescriptions for metoprolol, cogentin, and Risperdal, but has recently run out.  She does not have a PCP established.  Case management consult was utilized to assist patient in establishing care with PCP.   Findings and plan of care discussed with Margarita Grizzle, MD. Dr. Rosalia Hammers personally evaluated and examined this patient.   Final Clinical Impressions(s) / ED Diagnoses   Final diagnoses:  Sore throat    ED Discharge Orders        Ordered    amoxicillin-clavulanate (AUGMENTIN) 875-125 MG tablet  Every 12 hours     10/31/17 1221    metoprolol tartrate (LOPRESSOR) 50 MG tablet  2 times  daily     10/31/17 [address] PA-C 10/31/17 1241    Margarita Grizzle, MD 10/31/17 1652

## 2017-10-31 NOTE — Discharge Instructions (Addendum)
Due to persistent sore throat symptoms, follow-up with the ear nose and throat specialist as recommended. We will try a course of antibiotics. Please take all of your antibiotics until finished!   You may develop abdominal discomfort or diarrhea from the antibiotic.  You may help offset this with probiotics which you can buy or get in yogurt. Do not eat or take the probiotics until 2 hours after your antibiotic.   Hand washing: Wash your hands throughout the day, but especially before and after touching the face, using the restroom, sneezing, coughing, or touching surfaces that have been coughed or sneezed upon. Hydration: Symptoms will be intensified and complicated by dehydration. Dehydration can also extend the duration of symptoms. Drink plenty of fluids and get plenty of rest. You should be drinking at least half a liter of water an hour to stay hydrated. Electrolyte drinks (ex. Gatorade, Powerade, Pedialyte) are also encouraged. You should be drinking enough fluids to make your urine light yellow, almost clear. If this is not the case, you are not drinking enough water. Please note that some of the treatments indicated below will not be effective if you are not adequately hydrated. Diet: Please concentrate on hydration, however, you may introduce food slowly.  Start with a clear liquid diet, progressed to a full liquid diet, and then bland solids as you are able. Pain or fever: Ibuprofen, Naproxen, or Tylenol for pain or fever.  Zyrtec or Claritin: May add these medication daily to control underlying symptoms of congestion, sneezing, and other signs of allergies.  These medications are available over-the-counter. Flonase: Use this medication, as directed, for nasal and sinus congestion.  This medication is available over-the-counter. Congestion: Plain Mucinex may help relieve congestion. Saline sinus rinses and saline nasal sprays may also help relieve congestion. If you do not have heart problems or  an allergy to such medications, you may also try phenylephrine or Sudafed. Sore throat: Warm liquids or Chloraseptic spray may help soothe a sore throat. Gargle twice a day with a salt water solution made from a half teaspoon of salt in a cup of warm water.  Follow up: Follow-up with the ear nose and throat specialist on this matter. Return: Return to the ED for significantly worsening symptoms, shortness of breath, persistent vomiting, large amounts of blood in stool, or any other major concerns.  Please follow-up with a primary care provider soon as possible. Short refill has been provided for your blood pressure medications, however, you must establish care with a primary care provider in order to continue with this important medication.

## 2017-10-31 NOTE — ED Triage Notes (Addendum)
Pt reports on and off episodes of productive cough with clear sputum and losing her voice as well as sinus pressure and ear pain. Takes daily allergy pill but has not taken anything for acute symptoms.

## 2017-10-31 NOTE — ED Notes (Signed)
Pharmacy at the bedside

## 2017-10-31 NOTE — ED Notes (Signed)
Pt verbalized understanding discharge instructions and denies any further needs or questions at this time. VS stable, ambulatory and steady gait.   

## 2017-11-04 ENCOUNTER — Encounter: Payer: Self-pay | Admitting: *Deleted

## 2017-11-04 NOTE — Progress Notes (Signed)
EDCM consulted to assist pt with PCP establishment.  Pt has [employer] insurance; NCM provided instructions on calling number on back of card to obtain list of PCPs in area accepting his insurance along with a list of some known physician's offices accepting new pts.  No further needs communicated at this time.

## 2017-11-17 DIAGNOSIS — H6123 Impacted cerumen, bilateral: Secondary | ICD-10-CM | POA: Insufficient documentation

## 2017-11-17 DIAGNOSIS — J310 Chronic rhinitis: Secondary | ICD-10-CM | POA: Insufficient documentation

## 2017-11-17 DIAGNOSIS — R490 Dysphonia: Secondary | ICD-10-CM | POA: Insufficient documentation

## 2017-11-17 DIAGNOSIS — K219 Gastro-esophageal reflux disease without esophagitis: Secondary | ICD-10-CM | POA: Insufficient documentation

## 2019-09-05 ENCOUNTER — Ambulatory Visit
Admission: EM | Admit: 2019-09-05 | Discharge: 2019-09-05 | Disposition: A | Discharge status: Home / Self Care | Payer: Medicare Other | Attending: Physician Assistant | Admitting: Physician Assistant

## 2019-09-05 DIAGNOSIS — G8929 Other chronic pain: Secondary | ICD-10-CM

## 2019-09-05 DIAGNOSIS — J029 Acute pharyngitis, unspecified: Secondary | ICD-10-CM

## 2019-09-05 DIAGNOSIS — M25561 Pain in right knee: Secondary | ICD-10-CM

## 2019-09-05 MED ORDER — NAPROXEN 500 MG PO TABS: 500.0000 mg | ORAL_TABLET | Freq: Two times a day (BID) | ORAL | 0 refills | Status: DC

## 2019-09-05 NOTE — Discharge Instructions (Signed)
Naproxen for right knee pain. Rest, ice compress. Knee sleeve during activity. Follow up with PCP/orthopedics if symptoms not improving.  Sore throat could be from drainage from nose or acid reflux. We can call in medicine once you provide updated medicine list. Follow up with PCP if symptoms not improving.

## 2019-09-05 NOTE — ED Provider Notes (Signed)
[facility]    CSN: [id number] Arrival date & time: 09/05/19  1424      History   Chief Complaint Chief Complaint  Patient presents with  . Knee Pain    HPI ANTANISHA MOHS is a 69 y.o. female.   69 year old female comes in for multiple complaints  1. Chronic right knee pain with worsening x few weeks. States pain started worsening with increased exercise. No specific injury/trauma.  Denies swelling, erythema, warmth.  Started wearing the sleeves with some improvement of symptoms, but has continued to exercise.  No pain at rest, pain with weightbearing, mostly to the medial knee.  Denies numbness, tingling.  2.  Few month history of sore throat.  This is intermittent, worse when she restarts smoking.  Also with intermittent nasal congestion, drainage.  Has had baseline acid reflux, unsure if she is still on medication.  States may have some nausea in the morning at times, denies vomiting.  Denies abdominal pain.  Denies chest pain, shortness of breath.  Denies fever, chills, body aches. Has had her covid vaccines.      Past Medical History:  Diagnosis Date  . Anemia    iron deficiency  . Bipolar 1 disorder ([place])   . Diverticulosis 2008   sigmoid diverticular bleed in June 2008 s/p colonoscopy  . HTN (hypertension)   . Tobacco abuse     Patient Active Problem List   Diagnosis Date Noted  . Adjustment disorder with mixed disturbance of emotions and conduct 02/25/2017  . Urinary tract infection 02/25/2017  . Schizophrenia ([place]) 02/24/2017  . Obesity (BMI 30-39.9) 10/12/2011  . Preventative health care 08/26/2011  . TOBACCO ABUSE 07/22/2009  . ANEMIA-IRON DEFICIENCY 10/27/2006  . BIPOLAR AFFECTIVE DISORDER 10/27/2006  . HYPERTENSION 10/27/2006  . FOOT PAIN, CHRONIC 10/27/2006  . GASTROINTESTINAL HEMORRHAGE, HX OF 10/27/2006  . HYSTERECTOMY, TOTAL, HX OF 10/27/2006  . DIVERTICULOSIS, COLON, WITH HEMORRHAGE 10/14/2006    Past Surgical History:    Procedure Laterality Date  . TOTAL ABDOMINAL HYSTERECTOMY  1984    OB History   No obstetric history on file.      Home Medications    Prior to Admission medications   Medication Sig Start Date End Date Taking? Authorizing Provider  aspirin 81 MG chewable tablet Chew 81 mg by mouth daily.     [provider]  benztropine (COGENTIN) 1 MG tablet Take 1 mg by mouth 2 (two) times daily.    [provider]  metoprolol tartrate (LOPRESSOR) 50 MG tablet Take 50 mg by mouth 2 (two) times daily. 08/27/17   [provider]  naproxen (NAPROSYN) 500 MG tablet Take 1 tablet (500 mg total) by mouth 2 (two) times daily. 09/05/19   Tasia Catchings, Darrie Macmillan V, PA-C  risperiDONE (RISPERDAL) 1 MG tablet Take 1-3 mg by mouth See admin instructions. Take 1 mg in the morning and 3 mg in the evening    [provider]    Family History History reviewed. No pertinent family history.  Social History Social History   Tobacco Use  . Smoking status: Current Every Day Smoker  . Smokeless tobacco: Never Used  . Tobacco comment: 1pack/week  Substance Use Topics  . Alcohol use: Yes    Comment: rum  . Drug use: Yes    Types: Marijuana    Comment: uses frequently     Allergies   Patient has no known allergies.   Review of Systems Review of Systems  Reason unable  to perform ROS: See HPI as above.     Physical Exam Triage Vital Signs ED Triage Vitals  Enc Vitals Group     BP 09/05/19 1434 128/83     Pulse Rate 09/05/19 1434 (!) 130     Resp 09/05/19 1434 18     Temp 09/05/19 1434 98.5 F (36.9 C)     Temp Source 09/05/19 1434 Oral     SpO2 09/05/19 1434 95 %     Weight --      Height --      Head Circumference --      Peak Flow --      Pain Score 09/05/19 1438 3     Pain Loc --      Pain Edu? --      Excl. in GC? --    No data found.  Updated Vital Signs BP 128/83 (BP Location: Left Arm)   Pulse (!) 130   Temp 98.5 F (36.9 C) (Oral)   Resp 18   SpO2 95%    Visual Acuity Right Eye Distance:   Left Eye Distance:   Bilateral Distance:    Right Eye Near:   Left Eye Near:    Bilateral Near:     Physical Exam Constitutional:      General: She is not in acute distress.    Appearance: Normal appearance. She is well-developed. She is not toxic-appearing or diaphoretic.  HENT:     Head: Normocephalic and atraumatic.     Mouth/Throat:     Mouth: Mucous membranes are moist.     Pharynx: Oropharynx is clear. Uvula midline.  Eyes:     Conjunctiva/sclera: Conjunctivae normal.     Pupils: Pupils are equal, round, and reactive to light.  Cardiovascular:     Rate and Rhythm: Regular rhythm. Tachycardia present.     Comments: HR 105 on recheck Pulmonary:     Effort: Pulmonary effort is normal. No respiratory distress.     Comments: Speaking in full sentences without difficulty. LCTAB Musculoskeletal:     Cervical back: Normal range of motion and neck supple.     Comments: No swelling, erythema, warmth, contusion.  No tenderness to palpation of the knee.  Full range of motion.  Strength 5/5 BUE.  Sensation intact.  Skin:    General: Skin is warm and dry.  Neurological:     Mental Status: She is alert and oriented to person, place, and time.      UC Treatments / Results  Labs (all labs ordered are listed, but only abnormal results are displayed) Labs Reviewed - No data to display  EKG   Radiology No results found.  Procedures Procedures (including critical care time)  Medications Ordered in UC Medications - No data to display  Initial Impression / Assessment and Plan / UC Course  I have reviewed the triage vital signs and the nursing notes.  Pertinent labs & imaging results that were available during my care of the patient were reviewed by me and considered in my medical decision making (see chart for details).    1. Sore throat Chronic in nature.  Discussed possibility of postnasal drip versus acid reflux causing symptoms.   Patient states may already be taking medications for both, but unsure medication name.  States will check medication list when getting home.  Will refrain from prescribing medicine until then.  2.  Chronic right knee pain with acute exacerbation Likely inflammatory/arthritic in nature.  NSAIDs, ice compress, knee sleeve  during activity.  Return precautions given.  Patient expresses understanding and agrees to plan.  Final Clinical Impressions(s) / UC Diagnoses   Final diagnoses:  Sore throat  Chronic pain of right knee   ED Prescriptions    Medication Sig Dispense Auth. Provider   naproxen (NAPROSYN) 500 MG tablet Take 1 tablet (500 mg total) by mouth 2 (two) times daily. 20 tablet Belinda Fisher, PA-C     PDMP not reviewed this encounter.   Belinda Fisher, PA-C 09/05/19 1519

## 2019-09-05 NOTE — ED Triage Notes (Signed)
Pt chronic rt knee pain, denies injury, states pain d/t exercising. States pain started today and wearing a knee brace with relief. Pt also c/o a sore throat for over 2 months, states it gets worse when she quits smoking and starts back. States has had both covid vaccines.

## 2019-09-19 ENCOUNTER — Ambulatory Visit
Admission: EM | Admit: 2019-09-19 | Discharge: 2019-09-19 | Discharge status: Absent without leave | Payer: Medicare Other

## 2019-09-19 NOTE — ED Triage Notes (Signed)
Pt here to get help putting knee brace back on. No new problems

## 2019-10-31 ENCOUNTER — Telehealth: Payer: Self-pay

## 2019-10-31 NOTE — Telephone Encounter (Signed)

## 2019-11-01 ENCOUNTER — Ambulatory Visit: Payer: Medicare Other | Admitting: Internal Medicine

## 2019-11-27 ENCOUNTER — Telehealth: Payer: Self-pay

## 2019-11-27 NOTE — Patient Instructions (Signed)
Thank you for choosing Primary Care at [facility] to be your medical home!    Pamela Hardin was seen by De Hollingshead, DO today.   Pamela Hardin's primary care provider is Marcy Siren, DO.   For the best care possible, you should try to see Marcy Siren, DO whenever you come to the clinic.   We look forward to seeing you again soon!  If you have any questions about your visit today, please call us at [phone] or feel free to reach your primary care provider via MyChart.

## 2019-11-27 NOTE — Telephone Encounter (Signed)
Called patient to do their pre-visit COVID screening.  Call went to voicemail. Unable to do prescreening.  

## 2019-11-28 ENCOUNTER — Ambulatory Visit (INDEPENDENT_AMBULATORY_CARE_PROVIDER_SITE_OTHER): Payer: Medicare Other | Admitting: Internal Medicine

## 2019-11-28 ENCOUNTER — Encounter: Payer: Self-pay | Admitting: Internal Medicine

## 2019-11-28 VITALS — BP 151/98 | HR 92 | Temp 97.3°F | Resp 17 | Ht 63.5 in | Wt 163.0 lb

## 2019-11-28 DIAGNOSIS — I10 Essential (primary) hypertension: Secondary | ICD-10-CM | POA: Diagnosis not present

## 2019-11-28 DIAGNOSIS — K219 Gastro-esophageal reflux disease without esophagitis: Secondary | ICD-10-CM

## 2019-11-28 DIAGNOSIS — Z1159 Encounter for screening for other viral diseases: Secondary | ICD-10-CM

## 2019-11-28 DIAGNOSIS — Z1211 Encounter for screening for malignant neoplasm of colon: Secondary | ICD-10-CM

## 2019-11-28 DIAGNOSIS — Z131 Encounter for screening for diabetes mellitus: Secondary | ICD-10-CM

## 2019-11-28 DIAGNOSIS — Z23 Encounter for immunization: Secondary | ICD-10-CM

## 2019-11-28 DIAGNOSIS — Z13220 Encounter for screening for lipoid disorders: Secondary | ICD-10-CM

## 2019-11-28 DIAGNOSIS — Z1231 Encounter for screening mammogram for malignant neoplasm of breast: Secondary | ICD-10-CM

## 2019-11-28 DIAGNOSIS — Z13820 Encounter for screening for osteoporosis: Secondary | ICD-10-CM

## 2019-11-28 DIAGNOSIS — R600 Localized edema: Secondary | ICD-10-CM

## 2019-11-28 DIAGNOSIS — Z7689 Persons encountering health services in other specified circumstances: Secondary | ICD-10-CM | POA: Diagnosis not present

## 2019-11-28 MED ORDER — HYDROCHLOROTHIAZIDE 25 MG PO TABS: 25.0000 mg | ORAL_TABLET | Freq: Every day | ORAL | 3 refills | Status: DC

## 2019-11-28 MED ORDER — FAMOTIDINE 20 MG PO TABS: 20.0000 mg | ORAL_TABLET | Freq: Every day | ORAL | 2 refills | Status: DC

## 2019-11-28 NOTE — Progress Notes (Signed)
Subjective:    Pamela Hardin - 69 y.o. female MRN [id number]  Date of birth: 1950/10/25  HPI  Pamela Hardin is to establish care. Patient has a PMH significant for HTN, bipolar/schizophrenia in her "youth"--has not been on any medications for the past few years. History of GI bleed with anemia related to diverticulitis.   Has been without her BP medication for about one year. Has a cuff at home and has been getting "pretty good readings" but forgot to bring the paper with the numbers written down.   Has been having some swelling in her feet/ankles. It occurs bilaterally. Occurs intermittently. Gets better if she elevates her feet or wears compression stockings. Endorses some occasional SOB that she attributes to cigarette smoking.    ROS per HPI   Health Maintenance:  Health Maintenance Due  Topic Date Due  . Hepatitis C Screening  Never done  . COVID-19 Vaccine (1) Never done  . MAMMOGRAM  09/17/2013  . DEXA SCAN  Never done  . COLONOSCOPY  10/17/2016  . INFLUENZA VACCINE  11/26/2019     Past Medical History: Patient Active Problem List   Diagnosis Date Noted  . Adjustment disorder with mixed disturbance of emotions and conduct 02/25/2017  . Schizophrenia ([facility]) 02/24/2017  . Obesity (BMI 30-39.9) 10/12/2011  . TOBACCO ABUSE 07/22/2009  . ANEMIA-IRON DEFICIENCY 10/27/2006  . BIPOLAR AFFECTIVE DISORDER 10/27/2006  . HYPERTENSION 10/27/2006  . GASTROINTESTINAL HEMORRHAGE, HX OF 10/27/2006  . HYSTERECTOMY, TOTAL, HX OF 10/27/2006  . DIVERTICULOSIS, COLON, WITH HEMORRHAGE 10/14/2006      Social History   reports that she has been smoking. She has never used smokeless tobacco. She reports current alcohol use. She reports current drug use. Drug: Marijuana.   Family History  family history is not on file.   Medications: reviewed and updated   Objective:   Physical Exam BP (!) 151/98   Pulse 92   Temp (!) 97.3 F (36.3 C) (Temporal)   Resp 17   Ht 5' 3.5"  (1.613 m)   Wt 163 lb (73.9 kg)   SpO2 97%   BMI 28.42 kg/m  Physical Exam Constitutional:      General: She is not in acute distress.    Appearance: She is not diaphoretic.  HENT:     Head: Normocephalic and atraumatic.  Eyes:     Conjunctiva/sclera: Conjunctivae normal.  Cardiovascular:     Rate and Rhythm: Normal rate and regular rhythm.     Heart sounds: Normal heart sounds. No murmur heard.      Comments: 1+ LE edema to mid shins bilaterally.  Pulmonary:     Effort: Pulmonary effort is normal. No respiratory distress.     Comments: A few scattered wheezes throughout all lung fields. No crackles.  Musculoskeletal:        General: Normal range of motion.  Skin:    General: Skin is warm and dry.  Neurological:     Mental Status: She is alert and oriented to person, place, and time.  Psychiatric:        Mood and Affect: Affect normal.        Judgment: Judgment normal.         Assessment & Plan:   1. Encounter to establish care Reviewed patient's PMH, social history, surgical history, and medications.   2. Essential hypertension BP is elevated today. Patient asymptomatic. Was previously on Metoprolol. Nothing on history that would suggest that she would benefit from sole beta blocker  therapy. Therefore, will start HCTZ 25 mg. Return in 2 weeks for repeat BP check and lab monitoring.  - CBC with Differential - Comprehensive metabolic panel - hydrochlorothiazide (HYDRODIURIL) 25 MG tablet; Take 1 tablet (25 mg total) by mouth daily.  Dispense: 90 tablet; Refill: 3 - Basic Metabolic Panel; Future  3. Need for hepatitis C screening test - HCV Ab w/Rflx to Verification  4. Breast cancer screening by mammogram - MM Digital Screening; Future  5. Colon cancer screening - Ambulatory referral to Gastroenterology  6. Osteoporosis screening - DG Bone Density; Future  7. Lower extremity edema Suspect dependent edema given improvement with compression stockings and  elevation. Advised low sodium diet especially given h/o HTN. Will check some screening labs for other etiologies of edema. BNP added due to complaint of occasional SOB; although this likely could be contributed to tobacco history.  - CBC with Differential - Comprehensive metabolic panel - TSH - Brain natriuretic peptide  8. Gastroesophageal reflux disease without esophagitis - famotidine (PEPCID) 20 MG tablet; Take 1 tablet (20 mg total) by mouth daily.  Dispense: 30 tablet; Refill: 2  9. Screening for diabetes mellitus (DM) - Hemoglobin A1c  10. Screening for lipid disorders - Lipid panel; Future  11. Need for Tdap vaccination - Tdap vaccine greater than or equal to 7yo IM  12. Need for pneumococcal vaccine - Pneumococcal conjugate vaccine 13-valent      Marcy Siren, D.O. 11/28/2019, 2:47 PM Primary Care at [facility]

## 2019-11-29 LAB — CBC WITH DIFFERENTIAL/PLATELET
Basophils Absolute: 0.1 10*3/uL (ref 0.0–0.2)
Basos: 1 %
EOS (ABSOLUTE): 0.1 10*3/uL (ref 0.0–0.4)
Eos: 1 %
Hematocrit: 43.3 % (ref 34.0–46.6)
Hemoglobin: 14.9 g/dL (ref 11.1–15.9)
Immature Grans (Abs): 0 10*3/uL (ref 0.0–0.1)
Immature Granulocytes: 0 %
Lymphocytes Absolute: 1.6 10*3/uL (ref 0.7–3.1)
Lymphs: 26 %
MCH: 29.5 pg (ref 26.6–33.0)
MCHC: 34.4 g/dL (ref 31.5–35.7)
MCV: 86 fL (ref 79–97)
Monocytes Absolute: 0.8 10*3/uL (ref 0.1–0.9)
Monocytes: 13 %
Neutrophils Absolute: 3.6 10*3/uL (ref 1.4–7.0)
Neutrophils: 59 %
Platelets: 351 10*3/uL (ref 150–450)
RBC: 5.05 x10E6/uL (ref 3.77–5.28)
RDW: 13.3 % (ref 11.7–15.4)
WBC: 6.1 10*3/uL (ref 3.4–10.8)

## 2019-11-29 LAB — COMPREHENSIVE METABOLIC PANEL
ALT: 10 IU/L (ref 0–32)
AST: 16 IU/L (ref 0–40)
Albumin/Globulin Ratio: 1.3 (ref 1.2–2.2)
Albumin: 4.7 g/dL (ref 3.8–4.8)
Alkaline Phosphatase: 86 IU/L (ref 48–121)
BUN/Creatinine Ratio: 12 (ref 12–28)
BUN: 8 mg/dL (ref 8–27)
Bilirubin Total: 0.3 mg/dL (ref 0.0–1.2)
CO2: 23 mmol/L (ref 20–29)
Calcium: 10.4 mg/dL — ABNORMAL HIGH (ref 8.7–10.3)
Chloride: 103 mmol/L (ref 96–106)
Creatinine, Ser: 0.65 mg/dL (ref 0.57–1.00)
GFR calc Af Amer: 105 mL/min/{1.73_m2} (ref >59)
GFR calc non Af Amer: 92 mL/min/{1.73_m2} (ref >59)
Globulin, Total: 3.7 g/dL (ref 1.5–4.5)
Glucose: 101 mg/dL — ABNORMAL HIGH (ref 65–99)
Potassium: 4.1 mmol/L (ref 3.5–5.2)
Sodium: 140 mmol/L (ref 134–144)
Total Protein: 8.4 g/dL (ref 6.0–8.5)

## 2019-11-29 LAB — HEMOGLOBIN A1C
Est. average glucose Bld gHb Est-mCnc: 123 mg/dL
Hgb A1c MFr Bld: 5.9 % — ABNORMAL HIGH (ref 4.8–5.6)

## 2019-11-29 LAB — BRAIN NATRIURETIC PEPTIDE: BNP: 7.4 pg/mL (ref 0.0–100.0)

## 2019-11-29 LAB — TSH: TSH: 1.6 u[IU]/mL (ref 0.450–4.500)

## 2019-11-29 LAB — HCV AB W/RFLX TO VERIFICATION: HCV Ab: <0.1 s/co ratio (ref 0.0–0.9)

## 2019-11-29 LAB — HCV INTERPRETATION

## 2019-11-30 ENCOUNTER — Encounter: Payer: Self-pay | Admitting: Internal Medicine

## 2019-11-30 DIAGNOSIS — R7303 Prediabetes: Secondary | ICD-10-CM | POA: Insufficient documentation

## 2019-12-15 ENCOUNTER — Ambulatory Visit (INDEPENDENT_AMBULATORY_CARE_PROVIDER_SITE_OTHER): Payer: Medicare Other

## 2019-12-15 VITALS — BP 124/86 | HR 90

## 2019-12-15 DIAGNOSIS — I10 Essential (primary) hypertension: Secondary | ICD-10-CM

## 2019-12-15 NOTE — Progress Notes (Signed)
Patient here for BP check. Has taken BP medication this morning. After sitting BP was 124/86, pulse was 90. Spoke with provider who states to have her continue current medications & follow up in 3 months.  KWalker, CMA.

## 2019-12-28 ENCOUNTER — Encounter: Payer: Self-pay | Admitting: Internal Medicine

## 2019-12-28 ENCOUNTER — Other Ambulatory Visit: Payer: Self-pay

## 2019-12-28 ENCOUNTER — Telehealth (INDEPENDENT_AMBULATORY_CARE_PROVIDER_SITE_OTHER): Payer: Medicare Other | Admitting: Internal Medicine

## 2019-12-28 NOTE — Progress Notes (Signed)
Patient cancelled appointment. Encounter opened in error.   Marcy Siren, D.O. Primary Care at [facility]  01/10/2020, 7:59 PM

## 2020-01-15 ENCOUNTER — Other Ambulatory Visit: Payer: Medicare Other

## 2020-01-19 ENCOUNTER — Other Ambulatory Visit: Payer: Self-pay

## 2020-01-19 ENCOUNTER — Other Ambulatory Visit (INDEPENDENT_AMBULATORY_CARE_PROVIDER_SITE_OTHER): Payer: Medicare Other

## 2020-01-19 DIAGNOSIS — I10 Essential (primary) hypertension: Secondary | ICD-10-CM | POA: Diagnosis not present

## 2020-01-19 DIAGNOSIS — Z13220 Encounter for screening for lipoid disorders: Secondary | ICD-10-CM

## 2020-01-19 DIAGNOSIS — Z23 Encounter for immunization: Secondary | ICD-10-CM

## 2020-01-20 LAB — BASIC METABOLIC PANEL
BUN/Creatinine Ratio: 14 (ref 12–28)
BUN: 12 mg/dL (ref 8–27)
CO2: 27 mmol/L (ref 20–29)
Calcium: 10.1 mg/dL (ref 8.7–10.3)
Chloride: 98 mmol/L (ref 96–106)
Creatinine, Ser: 0.83 mg/dL (ref 0.57–1.00)
GFR calc Af Amer: 84 mL/min/{1.73_m2} (ref >59)
GFR calc non Af Amer: 73 mL/min/{1.73_m2} (ref >59)
Glucose: 115 mg/dL — ABNORMAL HIGH (ref 65–99)
Potassium: 3.5 mmol/L (ref 3.5–5.2)
Sodium: 140 mmol/L (ref 134–144)

## 2020-01-20 LAB — LIPID PANEL
Chol/HDL Ratio: 3.4 ratio (ref 0.0–4.4)
Cholesterol, Total: 190 mg/dL (ref 100–199)
HDL: 56 mg/dL (ref >39)
LDL Chol Calc (NIH): 123 mg/dL — ABNORMAL HIGH (ref 0–99)
Triglycerides: 61 mg/dL (ref 0–149)
VLDL Cholesterol Cal: 11 mg/dL (ref 5–40)

## 2020-01-22 ENCOUNTER — Telehealth: Payer: Self-pay

## 2020-01-22 NOTE — Telephone Encounter (Signed)
Erroneous encounter

## 2020-03-18 ENCOUNTER — Ambulatory Visit: Payer: Medicare Other | Admitting: Internal Medicine

## 2020-03-18 DIAGNOSIS — I10 Essential (primary) hypertension: Secondary | ICD-10-CM

## 2020-04-02 ENCOUNTER — Ambulatory Visit
Admission: RE | Admit: 2020-04-02 | Discharge: 2020-04-02 | Disposition: A | Discharge status: Home / Self Care | Payer: Medicare Other | Source: Ambulatory Visit | Attending: Internal Medicine | Admitting: Internal Medicine

## 2020-04-02 ENCOUNTER — Other Ambulatory Visit: Payer: Self-pay

## 2020-04-02 DIAGNOSIS — Z1231 Encounter for screening mammogram for malignant neoplasm of breast: Secondary | ICD-10-CM

## 2020-04-02 DIAGNOSIS — Z13820 Encounter for screening for osteoporosis: Secondary | ICD-10-CM

## 2020-04-12 ENCOUNTER — Other Ambulatory Visit: Payer: Self-pay | Admitting: Internal Medicine

## 2020-04-12 DIAGNOSIS — K219 Gastro-esophageal reflux disease without esophagitis: Secondary | ICD-10-CM

## 2020-05-22 ENCOUNTER — Other Ambulatory Visit: Payer: Self-pay | Admitting: Internal Medicine

## 2020-05-22 DIAGNOSIS — K219 Gastro-esophageal reflux disease without esophagitis: Secondary | ICD-10-CM

## 2020-12-06 ENCOUNTER — Telehealth: Payer: Self-pay | Admitting: Internal Medicine

## 2020-12-06 NOTE — Telephone Encounter (Signed)
Called patient to inform her that she needs to schedule an appointment for medication refill for her FAMOTIDNE 20MG  TABLETS

## 2021-01-17 IMAGING — MG DIGITAL SCREENING BILAT W/ CAD
7 series · 7 of 7 positions shown · non-contrast
Comparison: Previous exam(s).

CLINICAL DATA: Screening.

EXAM:
DIGITAL SCREENING BILATERAL MAMMOGRAM WITH CAD

[L MLO (1 of 2)]
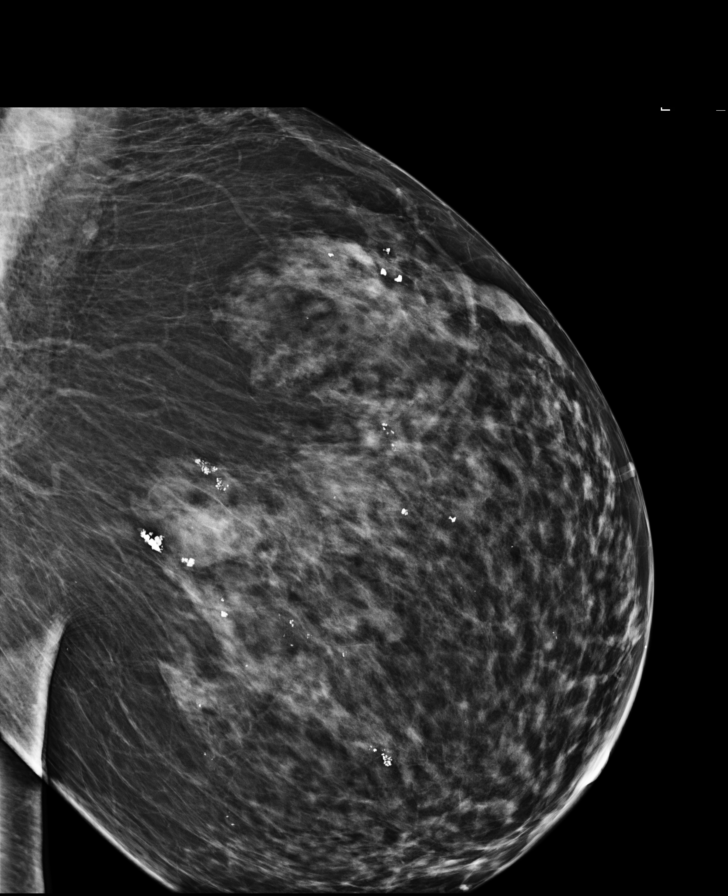

[L MLO (2 of 2)]
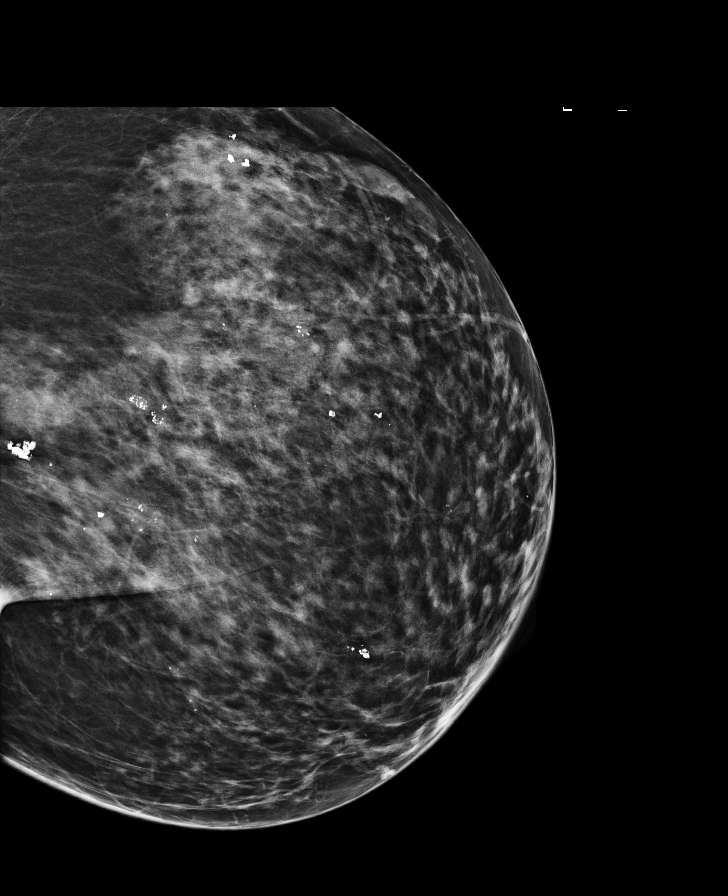

[L CC]
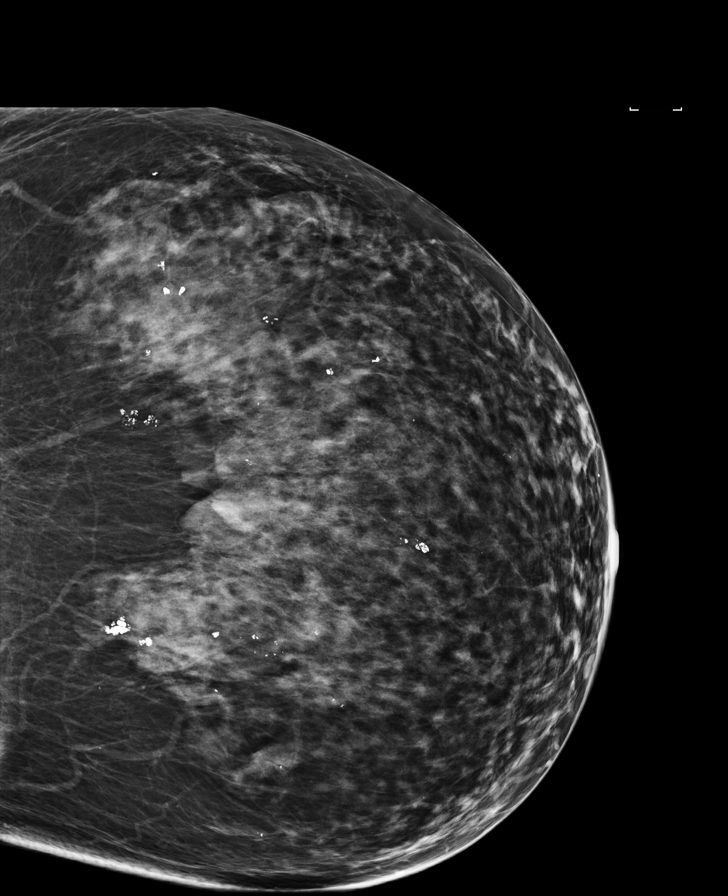

[R CC]
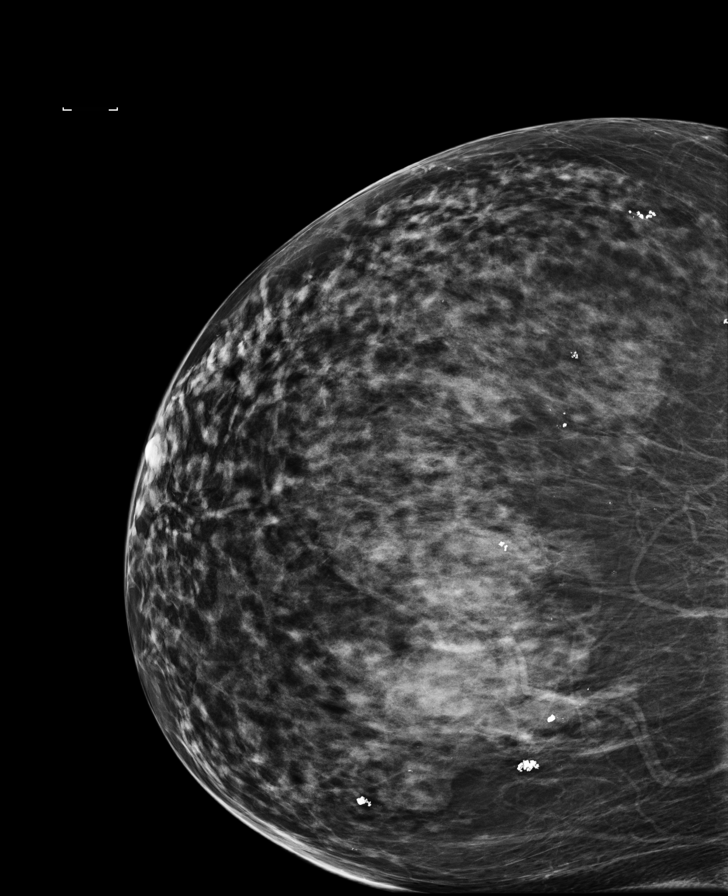

[R MLO (1 of 2)]
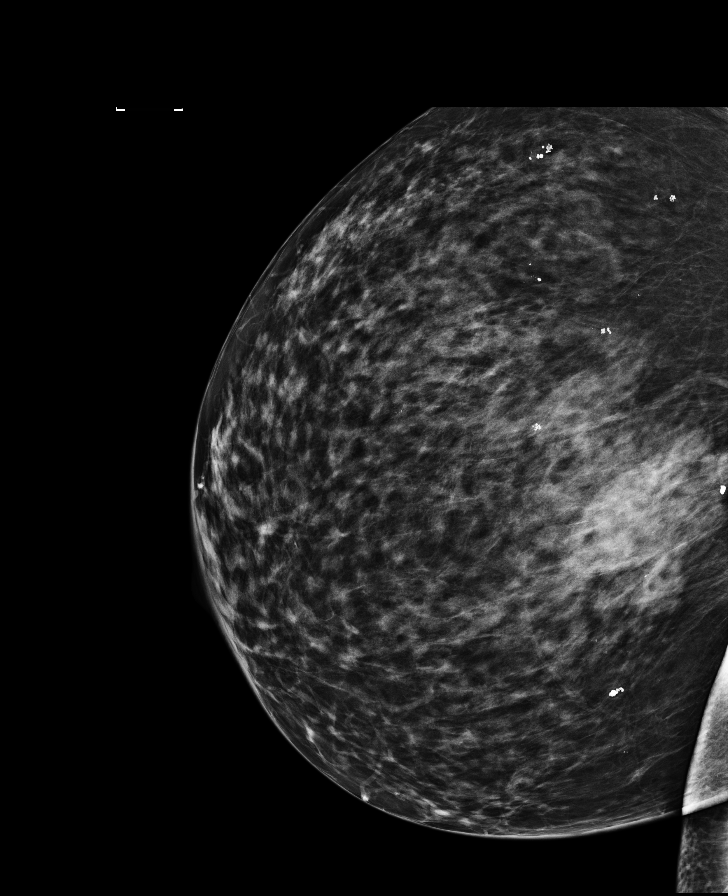

[R MLO (2 of 2)]
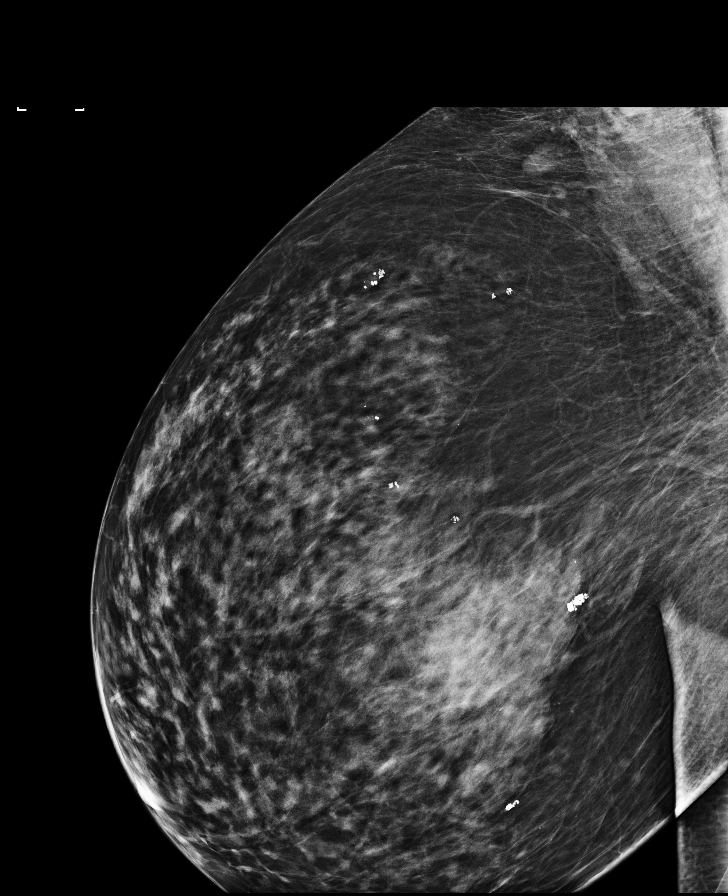

[R CV]
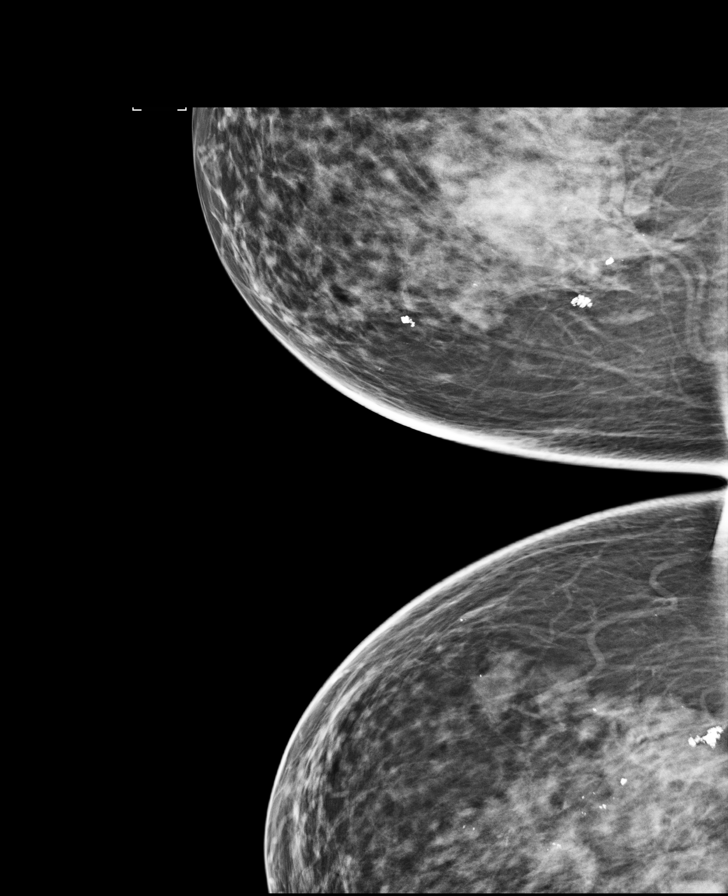

[7 of 7 positions shown; findings below may reference images not displayed]

ACR Breast Density Category c: The breast tissue is heterogeneously
dense, which may obscure small masses.
FINDINGS: There are no findings suspicious for malignancy. Images were
processed with CAD.
IMPRESSION: No mammographic evidence of malignancy. A result letter of this
screening mammogram will be mailed directly to the patient.

RECOMMENDATION:
Screening mammogram in one year. (Code:YJ-2-FEZ)

BI-RADS CATEGORY  1: Negative.

## 2021-02-12 ENCOUNTER — Other Ambulatory Visit: Payer: Self-pay

## 2021-02-12 ENCOUNTER — Ambulatory Visit (INDEPENDENT_AMBULATORY_CARE_PROVIDER_SITE_OTHER): Payer: Medicare Other | Admitting: Family Medicine

## 2021-02-12 ENCOUNTER — Encounter: Payer: Self-pay | Admitting: Family Medicine

## 2021-02-12 VITALS — BP 161/100 | HR 78 | Temp 98.1°F | Resp 16 | Ht 62.0 in | Wt 149.8 lb

## 2021-02-12 DIAGNOSIS — I10 Essential (primary) hypertension: Secondary | ICD-10-CM | POA: Diagnosis not present

## 2021-02-12 DIAGNOSIS — Z23 Encounter for immunization: Secondary | ICD-10-CM | POA: Diagnosis not present

## 2021-02-12 DIAGNOSIS — K219 Gastro-esophageal reflux disease without esophagitis: Secondary | ICD-10-CM | POA: Diagnosis not present

## 2021-02-12 MED ORDER — FAMOTIDINE 20 MG PO TABS: ORAL_TABLET | ORAL | 1 refills | Status: DC

## 2021-02-12 MED ORDER — HYDROCHLOROTHIAZIDE 25 MG PO TABS: 25.0000 mg | ORAL_TABLET | Freq: Every day | ORAL | 1 refills | Status: DC

## 2021-02-12 NOTE — Progress Notes (Signed)
Patient is here for medication refill with no other concerns today Health Maintenance reviewed - . Flu vaccine given

## 2021-02-12 NOTE — Progress Notes (Signed)
Established  Patient Office Visit  Subjective:  Patient ID: Pamela Hardin, female    DOB: 1950-10-04  Age: 70 y.o. MRN: [id number]  CC:  Chief Complaint  Patient presents with   Medication Refill    HPI Pamela Hardin presents for follow up of hypertension and GER.  Patient reports that she has not had her meds for at least 4-6 weeks.She reports that her blood pressure has been managed well when she takes her meds as recommended. She denies acute complaints.   Past Medical History:  Diagnosis Date   Anemia    iron deficiency   Bipolar 1 disorder ([facility])    Diverticulosis 2008   sigmoid diverticular bleed in June 2008 s/p colonoscopy   HTN (hypertension)    Tobacco abuse     Past Surgical History:  Procedure Laterality Date   TOTAL ABDOMINAL HYSTERECTOMY  1984    No family history on file.  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   Gastrointestinal: Negative.   All other systems reviewed and are negative.  Objective:   Today's Vitals: BP (!) 161/100   Pulse 78   Temp 98.1 F (36.7 C) (Oral)   Resp 16   Ht 5\' 2"  (1.575 m)   Wt 149 lb 12.8 oz (67.9 kg)   SpO2 97%   BMI 27.40 kg/m   Physical Exam Vitals and nursing note reviewed.  Constitutional:      General: She is not in acute distress. Cardiovascular:     Rate and Rhythm: Normal rate and regular rhythm.  Pulmonary:     Effort: Pulmonary effort is normal.     Breath sounds: Normal breath sounds.  Abdominal:     Palpations: Abdomen is soft.     Tenderness: There is no abdominal tenderness.  Musculoskeletal:     Right lower leg: No edema.     Left lower leg: No edema.  Neurological:     General: No focal deficit present.     Mental Status: She is alert and oriented to person, place,  and time.    Assessment & Plan:   1. Uncontrolled hypertension Compliance discussed. Meds refilled. Monitoring labs ordered. continue - hydrochlorothiazide (HYDRODIURIL) 25 MG tablet; Take 1 tablet (25 mg total) by mouth daily.  Dispense: 90 tablet; Refill: 1 - Basic Metabolic Panel - AST - ALT - Lipid Panel  2. Gastroesophageal reflux disease without esophagitis Doing well with present management. Meds refilled.  Continue  - famotidine (PEPCID) 20 MG tablet; TAKE 1 TABLET(20 MG) BY MOUTH DAILY  Dispense: 90 tablet; Refill: 1  3. Need for influenza vaccination  - Flu Vaccine QUAD 6+ mos PF IM (Fluarix Quad PF)    Outpatient Encounter Medications as of 02/12/2021  Medication Sig   aspirin 81 MG chewable tablet Chew 81 mg by mouth daily.    [DISCONTINUED] famotidine (PEPCID) 20 MG tablet TAKE 1 TABLET(20 MG) BY MOUTH DAILY   [DISCONTINUED] hydrochlorothiazide (HYDRODIURIL) 25 MG tablet Take 1 tablet (25 mg total) by mouth daily.   famotidine (PEPCID) 20  MG tablet TAKE 1 TABLET(20 MG) BY MOUTH DAILY   hydrochlorothiazide (HYDRODIURIL) 25 MG tablet Take 1 tablet (25 mg total) by mouth daily.   No facility-administered encounter medications on file as of 02/12/2021.    Follow-up: Return in about 6 months (around 08/13/2021) for follow up, chronic med issues.   Tommie Raymond, MD

## 2021-02-13 ENCOUNTER — Encounter: Payer: Self-pay | Admitting: Family Medicine

## 2021-02-13 LAB — BASIC METABOLIC PANEL
BUN/Creatinine Ratio: 9 — ABNORMAL LOW (ref 12–28)
BUN: 7 mg/dL — ABNORMAL LOW (ref 8–27)
CO2: 23 mmol/L (ref 20–29)
Calcium: 9.9 mg/dL (ref 8.7–10.3)
Chloride: 103 mmol/L (ref 96–106)
Creatinine, Ser: 0.74 mg/dL (ref 0.57–1.00)
Glucose: 82 mg/dL (ref 70–99)
Potassium: 3.6 mmol/L (ref 3.5–5.2)
Sodium: 141 mmol/L (ref 134–144)
eGFR: 88 mL/min/{1.73_m2} (ref >59)

## 2021-02-13 LAB — LIPID PANEL
Chol/HDL Ratio: 2.8 ratio (ref 0.0–4.4)
Cholesterol, Total: 174 mg/dL (ref 100–199)
HDL: 63 mg/dL (ref >39)
LDL Chol Calc (NIH): 101 mg/dL — ABNORMAL HIGH (ref 0–99)
Triglycerides: 52 mg/dL (ref 0–149)
VLDL Cholesterol Cal: 10 mg/dL (ref 5–40)

## 2021-02-13 LAB — ALT: ALT: 7 IU/L (ref 0–32)

## 2021-02-13 LAB — AST: AST: 14 IU/L (ref 0–40)

## 2021-04-22 ENCOUNTER — Other Ambulatory Visit: Payer: Self-pay | Admitting: *Deleted

## 2021-04-22 ENCOUNTER — Telehealth: Payer: Self-pay | Admitting: Family Medicine

## 2021-04-22 DIAGNOSIS — K219 Gastro-esophageal reflux disease without esophagitis: Secondary | ICD-10-CM

## 2021-04-22 MED ORDER — FAMOTIDINE 20 MG PO TABS: ORAL_TABLET | ORAL | 1 refills | Status: DC

## 2021-04-22 NOTE — Telephone Encounter (Signed)
Medication has been reordered.

## 2021-04-22 NOTE — Telephone Encounter (Signed)
Pt requesting 90 day supply due to lack of transportation at the moment and needs refill for  famotidine (PEPCID) 20 MG tablet [[id number]]    Pharmacy  [id number] [place], [place] - [address] AT [facility]  [address] [address]  Phone:  [phone]  Fax:  [phone]

## 2021-05-23 ENCOUNTER — Ambulatory Visit
Admission: EM | Admit: 2021-05-23 | Discharge: 2021-05-23 | Disposition: A | Discharge status: Home / Self Care | Payer: Medicare Other | Attending: Internal Medicine | Admitting: Internal Medicine

## 2021-05-23 ENCOUNTER — Other Ambulatory Visit: Payer: Self-pay

## 2021-05-23 DIAGNOSIS — J069 Acute upper respiratory infection, unspecified: Secondary | ICD-10-CM | POA: Diagnosis not present

## 2021-05-23 DIAGNOSIS — I10 Essential (primary) hypertension: Secondary | ICD-10-CM

## 2021-05-23 MED ORDER — BENZONATATE 100 MG PO CAPS: 100.0000 mg | ORAL_CAPSULE | Freq: Three times a day (TID) | ORAL | 0 refills | Status: AC | PRN

## 2021-05-23 MED ORDER — FLUTICASONE PROPIONATE 50 MCG/ACT NA SUSP: 1.0000 | Freq: Every day | NASAL | 0 refills | Status: AC

## 2021-05-23 MED ORDER — HYDROCHLOROTHIAZIDE 25 MG PO TABS: 25.0000 mg | ORAL_TABLET | Freq: Every day | ORAL | 0 refills | Status: DC

## 2021-05-23 NOTE — ED Provider Notes (Signed)
[facility]    CSN: [id number] Arrival date & time: 05/23/21  1022      History   Chief Complaint Chief Complaint  Patient presents with   Medication Refill    HPI Pamela Hardin is a 71 y.o. female.   Patient presents for blood pressure medication refill as well as 1 week history of feeling feverish, cough, nasal congestion.  Patient reports that she takes hydrochlorothiazide 25 mg daily and she took her last pill this morning.  She has not contacted her primary care to notify them that she is out of her medication.  She last saw primary care in October 2022 and had a 90-day refill.  She has been taking this medication for multiple years and has been tolerating well.  Denies chest pain, shortness of breath, blurred vision, headache, nausea, vomiting, dizziness.  Patient also has nasal congestion and nonproductive cough that has been present for approximately 1 week.  Denies any known fevers but reports that she has "felt feverish at times".  Denies any known sick contacts.  Patient has taken "allergy medicine" with minimal improvement of symptoms.  Denies chest pain, shortness of breath, sore throat, ear pain, nausea, vomiting, diarrhea, abdominal pain.   Medication Refill  Past Medical History:  Diagnosis Date   Anemia    iron deficiency   Bipolar 1 disorder ([place])    Diverticulosis 2008   sigmoid diverticular bleed in June 2008 s/p colonoscopy   HTN (hypertension)    Tobacco abuse     Patient Active Problem List   Diagnosis Date Noted   Prediabetes 11/30/2019   Adjustment disorder with mixed disturbance of emotions and conduct 02/25/2017   Schizophrenia ([place]) 02/24/2017   Obesity (BMI 30-39.9) 10/12/2011   TOBACCO ABUSE 07/22/2009   ANEMIA-IRON DEFICIENCY 10/27/2006   BIPOLAR AFFECTIVE DISORDER 10/27/2006   HYPERTENSION 10/27/2006   GASTROINTESTINAL HEMORRHAGE, HX OF 10/27/2006   HYSTERECTOMY, TOTAL, HX OF 10/27/2006   DIVERTICULOSIS, COLON, WITH  HEMORRHAGE 10/14/2006    Past Surgical History:  Procedure Laterality Date   TOTAL ABDOMINAL HYSTERECTOMY  1984    OB History   No obstetric history on file.      Home Medications    Prior to Admission medications   Medication Sig Start Date End Date Taking? Authorizing Provider  benzonatate (TESSALON) 100 MG capsule Take 1 capsule (100 mg total) by mouth every 8 (eight) hours as needed for cough. 05/23/21  Yes , Michele Rockers, FNP  fluticasone (FLONASE) 50 MCG/ACT nasal spray Place 1 spray into both nostrils daily for 3 days. 05/23/21 05/26/21 Yes Teodora Medici, FNP  aspirin 81 MG chewable tablet Chew 81 mg by mouth daily.     [provider]  famotidine (PEPCID) 20 MG tablet TAKE 1 TABLET(20 MG) BY MOUTH DAILY 04/22/21   Dorna Mai, MD  hydrochlorothiazide (HYDRODIURIL) 25 MG tablet Take 1 tablet (25 mg total) by mouth daily. 05/23/21   Teodora Medici, FNP    Family History History reviewed. No pertinent family history.  Social History Social History   Tobacco Use   Smoking status: Every Day    Types: Cigarettes   Smokeless tobacco: Never   Tobacco comments:    1pack/week  Substance Use Topics   Alcohol use: Yes    Comment: rum   Drug use: Yes    Types: Marijuana    Comment: uses frequently     Allergies   Patient has no known allergies.   Review of Systems Review  of Systems Per HPI  Physical Exam Triage Vital Signs ED Triage Vitals  Enc Vitals Group     BP 05/23/21 1206 (!) 155/99     Pulse Rate 05/23/21 1206 96     Resp 05/23/21 1206 16     Temp 05/23/21 1206 98.8 F (37.1 C)     Temp Source 05/23/21 1206 Oral     SpO2 05/23/21 1206 96 %     Weight --      Height --      Head Circumference --      Peak Flow --      Pain Score 05/23/21 1209 0     Pain Loc --      Pain Edu? --      Excl. in [place]? --    No data found.  Updated Vital Signs BP (!) 155/99 (BP Location: Left Arm)    Pulse 96    Temp 98.8 F (37.1 C) (Oral)    Resp 16     SpO2 96%   Visual Acuity Right Eye Distance:   Left Eye Distance:   Bilateral Distance:    Right Eye Near:   Left Eye Near:    Bilateral Near:     Physical Exam Constitutional:      General: She is not in acute distress.    Appearance: Normal appearance. She is not toxic-appearing or diaphoretic.  HENT:     Head: Normocephalic and atraumatic.     Right Ear: Tympanic membrane and ear canal normal.     Left Ear: Tympanic membrane and ear canal normal.     Nose: Congestion present.     Mouth/Throat:     Mouth: Mucous membranes are moist.     Pharynx: No posterior oropharyngeal erythema.  Eyes:     Extraocular Movements: Extraocular movements intact.     Conjunctiva/sclera: Conjunctivae normal.     Pupils: Pupils are equal, round, and reactive to light.  Cardiovascular:     Rate and Rhythm: Normal rate and regular rhythm.     Pulses: Normal pulses.     Heart sounds: Normal heart sounds.  Pulmonary:     Effort: Pulmonary effort is normal. No respiratory distress.     Breath sounds: Normal breath sounds. No stridor. No wheezing, rhonchi or rales.  Abdominal:     General: Abdomen is flat. Bowel sounds are normal.     Palpations: Abdomen is soft.  Musculoskeletal:        General: Normal range of motion.     Cervical back: Normal range of motion.  Skin:    General: Skin is warm and dry.  Neurological:     General: No focal deficit present.     Mental Status: She is alert and oriented to person, place, and time. Mental status is at baseline.     Cranial Nerves: Cranial nerves 2-12 are intact.     Sensory: Sensation is intact.     Motor: Motor function is intact.     Coordination: Coordination is intact.     Gait: Gait is intact.  Psychiatric:        Mood and Affect: Mood normal.        Behavior: Behavior normal.     UC Treatments / Results  Labs (all labs ordered are listed, but only abnormal results are displayed) Labs Reviewed  NOVEL CORONAVIRUS, NAA     EKG   Radiology No results found.  Procedures Procedures (including critical care time)  Medications Ordered  in UC Medications - No data to display  Initial Impression / Assessment and Plan / UC Course  I have reviewed the triage vital signs and the nursing notes.  Pertinent labs & imaging results that were available during my care of the patient were reviewed by me and considered in my medical decision making (see chart for details).     Patient presents with symptoms likely from a viral upper respiratory infection. Differential includes bacterial pneumonia, sinusitis, allergic rhinitis, COVID-19, flu. Do not suspect underlying cardiopulmonary process. Symptoms seem unlikely related to ACS, CHF or COPD exacerbations, pneumonia, pneumothorax. Patient is nontoxic appearing and not in need of emergent medical intervention. Recommended symptom control with over the counter medications.  Patient sent prescriptions.  Do not think that chest imaging necessary given no adventitious lung sounds on exam and no signs of respiratory compromise.  COVID-19 PCR pending.Return if symptoms fail to improve in 1-2 weeks or you develop shortness of breath, chest pain, severe headache. Patient states understanding and is agreeable.  Will refill hydrochlorothiazide 25 mg daily since patient is out of this medication.  Only feel comfortable refilling for 30 days given that I am not her PCP.  Patient to follow-up with PCP as soon as possible for further evaluation and management of essential hypertension.  Discharged with PCP followup.  Final Clinical Impressions(s) / UC Diagnoses   Final diagnoses:  Essential hypertension  Viral upper respiratory tract infection with cough     Discharge Instructions      Blood pressure medications have been refilled for 30 days.  Please follow-up with primary care doctor to have more extensive evaluation and blood pressure medications refilled.  It also appears  that you have a viral upper respiratory infection.  You have been prescribed a nasal spray and a cough medication to help alleviate the symptoms.    ED Prescriptions     Medication Sig Dispense Auth. Provider   hydrochlorothiazide (HYDRODIURIL) 25 MG tablet Take 1 tablet (25 mg total) by mouth daily. 30 tablet [place], Mov[place] E, [place]   fluticasone [facility]) 50 MCG/ACT nasal spray Place 1 spray into both nostrils daily for 3 days. 16 g , Hildred Alamin E, [place]   benzonatate (TESSALON) 100 MG capsule Take 1 capsule (100 mg total) by mouth every 8 (eight) hours as needed for cough. 21 capsule [place], Michele Rockers, [place]      PDMP not reviewed this encounter.   Teodora Medici, [place] 05/23/21 1255

## 2021-05-23 NOTE — Discharge Instructions (Signed)
Blood pressure medications have been refilled for 30 days.  Please follow-up with primary care doctor to have more extensive evaluation and blood pressure medications refilled.  It also appears that you have a viral upper respiratory infection.  You have been prescribed a nasal spray and a cough medication to help alleviate the symptoms.

## 2021-05-23 NOTE — ED Triage Notes (Signed)
Pt reports today saying that she needs a flu shot and blood work. She ran out of BP meds today. She also reports feeling "feverish" for over a week with some cough and congestion. Denies pain.

## 2021-05-24 LAB — SARS-COV-2, NAA 2 DAY TAT

## 2021-05-24 LAB — NOVEL CORONAVIRUS, NAA: SARS-CoV-2, NAA: NOT DETECTED

## 2021-08-21 NOTE — Progress Notes (Deleted)
Patient ID: Pamela Hardin, female    DOB: 12/10/1950  MRN: [id number]  CC: No chief complaint on file.   Subjective: Pamela Hardin is a 71 y.o. female who presents for Her concerns today include: ***  Schedule notes say flu shot...  Patient Active Problem List   Diagnosis Date Noted   Prediabetes 11/30/2019   Adjustment disorder with mixed disturbance of emotions and conduct 02/25/2017   Schizophrenia ([facility]) 02/24/2017   Obesity (BMI 30-39.9) 10/12/2011   TOBACCO ABUSE 07/22/2009   ANEMIA-IRON DEFICIENCY 10/27/2006   BIPOLAR AFFECTIVE DISORDER 10/27/2006   HYPERTENSION 10/27/2006   GASTROINTESTINAL HEMORRHAGE, HX OF 10/27/2006   HYSTERECTOMY, TOTAL, HX OF 10/27/2006   DIVERTICULOSIS, COLON, WITH HEMORRHAGE 10/14/2006     Current Outpatient Medications on File Prior to Visit  Medication Sig Dispense Refill   aspirin 81 MG chewable tablet Chew 81 mg by mouth daily.      benzonatate (TESSALON) 100 MG capsule Take 1 capsule (100 mg total) by mouth every 8 (eight) hours as needed for cough. 21 capsule 0   famotidine (PEPCID) 20 MG tablet TAKE 1 TABLET(20 MG) BY MOUTH DAILY 90 tablet 1   fluticasone (FLONASE) 50 MCG/ACT nasal spray Place 1 spray into both nostrils daily for 3 days. 16 g 0   hydrochlorothiazide (HYDRODIURIL) 25 MG tablet Take 1 tablet (25 mg total) by mouth daily. 30 tablet 0   No current facility-administered medications on file prior to visit.    No Known Allergies  Social History   Socioeconomic History   Marital status: Single    Spouse name: Not on file   Number of children: Not on file   Years of education: Not on file   Highest education level: Not on file  Occupational History   Not on file  Tobacco Use   Smoking status: Every Day    Types: Cigarettes   Smokeless tobacco: Never   Tobacco comments:    1pack/week  Substance and Sexual Activity   Alcohol use: Yes    Comment: rum   Drug use: Yes    Types: Marijuana    Comment: uses  frequently   Sexual activity: Not Currently  Other Topics Concern   Not on file  Social History Narrative   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  Past Surgical History:  Procedure Laterality Date   TOTAL ABDOMINAL HYSTERECTOMY  1984    ROS: Review of Systems Negative except as stated above  PHYSICAL EXAM: There were no vitals taken for this visit.  Physical Exam  {female adult master:310786} {female adult master:310785}     Latest Ref Rng & Units 02/12/2021    2:21 PM 01/19/2020    8:48 AM 11/28/2019    2:41 PM  CMP  Glucose 70 - 99 mg/dL 82  115   101    BUN 8 - 27 mg/dL 7   12   8     Creatinine 0.57 - 1.00 mg/dL   7.82   4.23    Sodium 134 - 144 mmol/L 141   140   140    Potassium 3.5 - 5.2 mmol/L 3.6   3.5   4.1    Chloride 96 - 106 mmol/L 103   98   103    CO2 20 - 29 mmol/L 23   27   23     Calcium 8.7 - 10.3 mg/dL 9.9   5.36      Total Protein 6.0 - 8.5 g/dL   8.4    Total Bilirubin 0.0 - 1.2 mg/dL   0.3    Alkaline Phos 48 - 121 IU/L   86    AST 0 - 40 IU/L 14    16    ALT 0 - 32 IU/L 7    10     Lipid Panel     Component Value Date/Time   CHOL 174 02/12/2021 1421   TRIG 52 02/12/2021 1421   HDL 63 02/12/2021 1421   CHOLHDL 2.8 02/12/2021 1421   CHOLHDL 3.8 10/12/2011 1025   VLDL 13 10/12/2011 1025   LDLCALC 101 (H) 02/12/2021 1421    CBC    Component Value Date/Time   WBC 6.1 11/28/2019 1441    WBC 6.9 06/08/2017 0530   RBC 5.05 11/28/2019 1441   RBC 4.99 06/08/2017 0530   HGB 14.9 11/28/2019 1441   HCT 43.3 11/28/2019 1441   PLT 351 11/28/2019 1441   MCV 86 11/28/2019 1441   MCH 29.5 11/28/2019 1441   MCH 28.3 06/08/2017 0530   MCHC 34.4 11/28/2019 1441   MCHC 33.3 06/08/2017 0530   RDW 13.3 11/28/2019 1441   LYMPHSABS 1.6 11/28/2019 1441   MONOABS 0.7 06/08/2017 0530   EOSABS 0.1 11/28/2019 1441   BASOSABS 0.1 11/28/2019 1441    ASSESSMENT AND PLAN:  There are no diagnoses linked to this encounter.   Patient was given the opportunity to ask questions.  Patient verbalized understanding of the plan and was able to repeat key elements of the plan. Patient was given clear instructions to go to Emergency Department or return to medical center if symptoms don't improve, worsen, or new problems develop.The patient verbalized understanding.   No orders of the defined types were placed in this encounter.    Requested Prescriptions    No prescriptions requested or ordered in this encounter    No follow-ups on file.  01/28/2020, NP

## 2021-08-26 ENCOUNTER — Ambulatory Visit: Payer: Medicare Other | Admitting: Family

## 2021-10-15 ENCOUNTER — Other Ambulatory Visit: Payer: Self-pay | Admitting: Family Medicine

## 2021-10-15 DIAGNOSIS — I10 Essential (primary) hypertension: Secondary | ICD-10-CM

## 2021-10-15 NOTE — Telephone Encounter (Signed)
Requested medication (s) are due for refill today: yes  Requested medication (s) are on the active medication list: yes  Last refill:  05/23/21 #30/0  Future visit scheduled: no  Notes to clinic:  pt is overdue for an appt and updated labs. Pt called, LVMTCB     Requested Prescriptions  Pending Prescriptions Disp Refills   hydrochlorothiazide (HYDRODIURIL) 25 MG tablet [Pharmacy Med Name: HYDROCHLOROTHIAZIDE 25MG  TABLETS] 90 tablet     Sig: TAKE 1 TABLET(25 MG) BY MOUTH DAILY     Cardiovascular: Diuretics - Thiazide Failed - 10/15/2021  6:38 AM      Failed - Cr in normal range and within 180 days    Creat  Date Value Ref Range Status  09/11/2010 0.66 0.40 - 1.20 mg/dL Final   Creatinine, Ser  Date Value Ref Range Status  02/12/2021 0.74 0.57 - 1.00 mg/dL Final         Failed - K in normal range and within 180 days    Potassium  Date Value Ref Range Status  02/12/2021 3.6 3.5 - 5.2 mmol/L Final         Failed - Na in normal range and within 180 days    Sodium  Date Value Ref Range Status  02/12/2021 141 134 - 144 mmol/L Final         Failed - Last BP in normal range    BP Readings from Last 1 Encounters:  05/23/21 (!) 155/99         Failed - Valid encounter within last 6 months    Recent Outpatient Visits           8 months ago Uncontrolled hypertension   Primary Care at [facility], [place]   1 year ago    Primary Care at [facility], [facility], [place]   1 year ago Encounter to establish care   Primary Care at [facility], [facility], [place]

## 2021-10-15 NOTE — Telephone Encounter (Signed)
Pt called, unable to leave VM d/t not set up. Pt is due for appt for medication refill.

## 2021-10-16 ENCOUNTER — Other Ambulatory Visit: Payer: Self-pay | Admitting: Family Medicine

## 2021-10-16 DIAGNOSIS — I10 Essential (primary) hypertension: Secondary | ICD-10-CM

## 2021-10-30 ENCOUNTER — Other Ambulatory Visit: Payer: Self-pay | Admitting: Family Medicine

## 2021-10-30 DIAGNOSIS — K219 Gastro-esophageal reflux disease without esophagitis: Secondary | ICD-10-CM

## 2021-12-31 ENCOUNTER — Telehealth: Payer: Self-pay

## 2021-12-31 NOTE — Patient Outreach (Signed)
  Care Coordination   12/31/2021 Name: Pamela Hardin MRN: [id number] DOB: 30-Apr-1950   Care Coordination Outreach Attempts:  An unsuccessful telephone outreach was attempted today to offer the patient information about available care coordination services as a benefit of their health plan.   Follow Up Plan:  Additional outreach attempts will be made to offer the patient care coordination information and services.   Encounter Outcome:  No Answer  Care Coordination Interventions Activated:  No   Care Coordination Interventions:  No, not indicated    Bary Leriche, RN, MSN [facility] Care Management Care Management Coordinator Direct Line [phone] Toll Free: [phone]  Fax: [phone]

## 2022-01-08 ENCOUNTER — Telehealth: Payer: Self-pay

## 2022-01-08 NOTE — Patient Outreach (Signed)
  Care Coordination   01/08/2022 Name: Pamela Hardin MRN: [id number] DOB: 11-17-1950   Care Coordination Outreach Attempts:  A second unsuccessful outreach was attempted today to offer the patient with information about available care coordination services as a benefit of their health plan.     Follow Up Plan:  Additional outreach attempts will be made to offer the patient care coordination information and services.   Encounter Outcome:  No Answer  Care Coordination Interventions Activated:  No   Care Coordination Interventions:  No, not indicated    Bary Leriche, RN, MSN [facility] Care Management Care Management Coordinator Direct Line [phone]

## 2022-01-13 ENCOUNTER — Other Ambulatory Visit: Payer: Self-pay | Admitting: Family Medicine

## 2022-01-13 DIAGNOSIS — I10 Essential (primary) hypertension: Secondary | ICD-10-CM

## 2022-01-16 ENCOUNTER — Telehealth: Payer: Self-pay

## 2022-01-16 NOTE — Patient Outreach (Signed)
  Care Coordination   01/16/2022 Name: Pamela Hardin MRN: [id number] DOB: [birth year]   Care Coordination Outreach Attempts:  A third unsuccessful outreach was attempted today to offer the patient with information about available care coordination services as a benefit of their health plan.   Follow Up Plan:  No further outreach attempts will be made at this time. We have been unable to contact the patient to offer or enroll patient in care coordination services  Encounter Outcome:  No Answer  Care Coordination Interventions Activated:  No   Care Coordination Interventions:  No, not indicated    Jone Baseman, RN, MSN [place] [facility] Care Management Coordinator Direct Line [phone]

## 2022-02-23 ENCOUNTER — Other Ambulatory Visit: Payer: Self-pay | Admitting: Family Medicine

## 2022-02-23 DIAGNOSIS — K219 Gastro-esophageal reflux disease without esophagitis: Secondary | ICD-10-CM

## 2022-02-23 DIAGNOSIS — I10 Essential (primary) hypertension: Secondary | ICD-10-CM

## 2022-03-27 ENCOUNTER — Other Ambulatory Visit: Payer: Self-pay | Admitting: Family Medicine

## 2022-03-27 DIAGNOSIS — I10 Essential (primary) hypertension: Secondary | ICD-10-CM

## 2022-03-27 DIAGNOSIS — K219 Gastro-esophageal reflux disease without esophagitis: Secondary | ICD-10-CM

## 2022-03-27 MED ORDER — HYDROCHLOROTHIAZIDE 25 MG PO TABS: ORAL_TABLET | ORAL | 0 refills | Status: DC

## 2022-03-27 MED ORDER — FAMOTIDINE 20 MG PO TABS: ORAL_TABLET | ORAL | 0 refills | Status: DC

## 2022-03-27 NOTE — Telephone Encounter (Signed)
Copied from CRM [id number]. Topic: General - Other >> Mar 27, 2022  9:07 AM Everette C wrote: Reason for CRM: Medication Refill - Medication: famotidine (PEPCID) 20 MG tablet [[id number]]  hydrochlorothiazide (HYDRODIURIL) 25 MG tablet [[id number]]  Has the patient contacted their pharmacy? Yes.  The patient has been directed to contact their PCP  (Agent: If no, request that the patient contact the pharmacy for the refill. If patient does not wish to contact the pharmacy document the reason why and proceed with request.) (Agent: If yes, when and what did the pharmacy advise?)  Preferred Pharmacy (with phone number or street name): [id number] [place], [place] - [address] AT [facility] [address] [place] [address] Phone: [phone] Fax: [phone] Hours: Not open 24 hours   Has the patient been seen for an appointment in the last year OR does the patient have an upcoming appointment? Yes.    Agent: Please be advised that RX refills may take up to 3 business days. We ask that you follow-up with your pharmacy.

## 2022-03-27 NOTE — Telephone Encounter (Signed)
Patient needs OV, will refill medication for 30 days. Patient needs OV for additional refills.  Requested Prescriptions  Pending Prescriptions Disp Refills   famotidine (PEPCID) 20 MG tablet 30 tablet 0    Sig: TAKE 1 TABLET(20 MG) BY MOUTH DAILY     Gastroenterology:  H2 Antagonists Failed - 03/27/2022 10:20 AM      Failed - Valid encounter within last 12 months    Recent Outpatient Visits           1 year ago Uncontrolled hypertension   Primary Care at [facility], [place]   2 years ago    Primary Care at [facility], Kandee Keen, [place]   2 years ago Encounter to establish care   Primary Care at [facility], Kandee Keen, [place]               hydrochlorothiazide (HYDRODIURIL) 25 MG tablet 30 tablet 0    Sig: TAKE 1 TABLET(25 MG) BY MOUTH DAILY     Cardiovascular: Diuretics - Thiazide Failed - 03/27/2022 10:20 AM      Failed - Cr in normal range and within 180 days    Creat  Date Value Ref Range Status  09/11/2010 0.66 0.40 - 1.20 mg/dL Final   Creatinine, Ser  Date Value Ref Range Status  02/12/2021 0.74 0.57 - 1.00 mg/dL Final         Failed - K in normal range and within 180 days    Potassium  Date Value Ref Range Status  02/12/2021 3.6 3.5 - 5.2 mmol/L Final         Failed - Na in normal range and within 180 days    Sodium  Date Value Ref Range Status  02/12/2021 141 134 - 144 mmol/L Final         Failed - Last BP in normal range    BP Readings from Last 1 Encounters:  05/23/21 (!) 155/99         Failed - Valid encounter within last 6 months    Recent Outpatient Visits           1 year ago Uncontrolled hypertension   Primary Care at [facility], [place]   2 years ago    Primary Care at [facility], Kandee Keen, [place]   2 years ago Encounter to establish care   Primary Care at [facility], Kandee Keen, [place]

## 2022-05-04 ENCOUNTER — Other Ambulatory Visit: Payer: Self-pay | Admitting: Family Medicine

## 2022-05-04 DIAGNOSIS — K219 Gastro-esophageal reflux disease without esophagitis: Secondary | ICD-10-CM

## 2022-05-04 DIAGNOSIS — I10 Essential (primary) hypertension: Secondary | ICD-10-CM

## 2022-05-08 ENCOUNTER — Telehealth: Payer: Self-pay

## 2022-05-08 NOTE — Patient Outreach (Signed)
  Care Coordination   05/08/2022 Name: ALAJA GOLDINGER MRN: [id number] DOB: December 05, 1950   Care Coordination Outreach Attempts:  An unsuccessful telephone outreach was attempted today to offer the patient information about available care coordination services as a benefit of their health plan.   Follow Up Plan:  Additional outreach attempts will be made to offer the patient care coordination information and services.   Encounter Outcome:  No Answer   Care Coordination Interventions:  No, not indicated    Jone Baseman, RN, MSN [facility] Care Management Coordinator Direct Line [phone]

## 2022-05-13 NOTE — Telephone Encounter (Signed)
Requested medication (s) are due for refill today - yes  Requested medication (s) are on the active medication list -yes  Future visit scheduled -yes  Last refill: 03/27/22 #30  Notes to clinic: Patient has scheduled appointment- fails visit protocol, courtesy already- sent for PCP review   Requested Prescriptions  Pending Prescriptions Disp Refills   hydrochlorothiazide (HYDRODIURIL) 25 MG tablet 30 tablet 0    Sig: TAKE 1 TABLET(25 MG) BY MOUTH DAILY     Cardiovascular: Diuretics - Thiazide Failed - 05/13/2022  3:23 PM      Failed - Cr in normal range and within 180 days    Creat  Date Value Ref Range Status  09/11/2010 0.66 0.40 - 1.20 mg/dL Final   Creatinine, Ser  Date Value Ref Range Status  02/12/2021 0.74 0.57 - 1.00 mg/dL Final         Failed - K in normal range and within 180 days    Potassium  Date Value Ref Range Status  02/12/2021 3.6 3.5 - 5.2 mmol/L Final         Failed - Na in normal range and within 180 days    Sodium  Date Value Ref Range Status  02/12/2021 141 134 - 144 mmol/L Final         Failed - Last BP in normal range    BP Readings from Last 1 Encounters:  05/23/21 (!) 155/99         Failed - Valid encounter within last 6 months    Recent Outpatient Visits           1 year ago Uncontrolled hypertension   Primary Care at [facility], MD   2 years ago    Primary Care at [facility], Kandee Keen, MD   2 years ago Encounter to establish care   Primary Care at [facility], Kandee Keen, MD       Future Appointments             In 3 weeks Georganna Skeans, MD Primary Care at [facility]             famotidine (PEPCID) 20 MG tablet 30 tablet 0    Sig: TAKE 1 TABLET(20 MG) BY MOUTH DAILY     Gastroenterology:  H2 Antagonists Failed - 05/13/2022  3:23 PM      Failed - Valid encounter within last 12 months    Recent Outpatient Visits           1 year ago Uncontrolled hypertension    Primary Care at [facility], MD   2 years ago    Primary Care at [facility], Kandee Keen, MD   2 years ago Encounter to establish care   Primary Care at [facility], Kandee Keen, MD       Future Appointments             In 3 weeks Georganna Skeans, MD Primary Care at [facility]            Refused Prescriptions Disp Refills   famotidine (PEPCID) 20 MG tablet [Pharmacy Med Name: FAMOTIDINE 20MG  TABLETS] 90 tablet     Sig: TAKE 1 TABLET(20 MG) BY MOUTH DAILY     Gastroenterology:  H2 Antagonists Failed - 05/13/2022  3:23 PM      Failed - Valid encounter within last 12 months    Recent Outpatient Visits  1 year ago Uncontrolled hypertension   Primary Care at [facility], MD   2 years ago    Primary Care at [facility], Bayard Beaver, MD   2 years ago Encounter to establish care   Primary Care at [facility], Bayard Beaver, MD       Future Appointments             In 3 weeks Dorna Mai, MD Primary Care at [facility]             hydrochlorothiazide (HYDRODIURIL) 25 MG tablet [Pharmacy Med Name: HYDROCHLOROTHIAZIDE 25MG TABLETS] 90 tablet     Sig: TAKE 1 TABLET(25 MG) BY MOUTH DAILY     Cardiovascular: Diuretics - Thiazide Failed - 05/13/2022  3:23 PM      Failed - Cr in normal range and within 180 days    Creat  Date Value Ref Range Status  09/11/2010 0.66 0.40 - 1.20 mg/dL Final   Creatinine, Ser  Date Value Ref Range Status  02/12/2021 0.74 0.57 - 1.00 mg/dL Final         Failed - K in normal range and within 180 days    Potassium  Date Value Ref Range Status  02/12/2021 3.6 3.5 - 5.2 mmol/L Final         Failed - Na in normal range and within 180 days    Sodium  Date Value Ref Range Status  02/12/2021 141 134 - 144 mmol/L Final         Failed - Last BP in normal range    BP Readings from Last 1 Encounters:  05/23/21 (!) 155/99          Failed - Valid encounter within last 6 months    Recent Outpatient Visits           1 year ago Uncontrolled hypertension   Primary Care at [facility], MD   2 years ago    Primary Care at [facility], Bayard Beaver, MD   2 years ago Encounter to establish care   Primary Care at [facility], Bayard Beaver, MD       Future Appointments             In 3 weeks Dorna Mai, MD Primary Care at [facility]               Requested Prescriptions  Pending Prescriptions Disp Refills   hydrochlorothiazide (HYDRODIURIL) 25 MG tablet 30 tablet 0    Sig: TAKE 1 TABLET(25 MG) BY MOUTH DAILY     Cardiovascular: Diuretics - Thiazide Failed - 05/13/2022  3:23 PM      Failed - Cr in normal range and within 180 days    Creat  Date Value Ref Range Status  09/11/2010 0.66 0.40 - 1.20 mg/dL Final   Creatinine, Ser  Date Value Ref Range Status  02/12/2021 0.74 0.57 - 1.00 mg/dL Final         Failed - K in normal range and within 180 days    Potassium  Date Value Ref Range Status  02/12/2021 3.6 3.5 - 5.2 mmol/L Final         Failed - Na in normal range and within 180 days    Sodium  Date Value Ref Range Status  02/12/2021 141 134 - 144 mmol/L Final         Failed - Last BP in normal range    BP Readings from  Last 1 Encounters:  05/23/21 (!) 155/99         Failed - Valid encounter within last 6 months    Recent Outpatient Visits           1 year ago Uncontrolled hypertension   Primary Care at [facility], MD   2 years ago    Primary Care at [facility], Kandee Keen, MD   2 years ago Encounter to establish care   Primary Care at [facility], Kandee Keen, MD       Future Appointments             In 3 weeks Georganna Skeans, MD Primary Care at [facility]             famotidine (PEPCID) 20 MG tablet 30 tablet 0    Sig: TAKE 1 TABLET(20 MG) BY MOUTH DAILY      Gastroenterology:  H2 Antagonists Failed - 05/13/2022  3:23 PM      Failed - Valid encounter within last 12 months    Recent Outpatient Visits           1 year ago Uncontrolled hypertension   Primary Care at [facility], MD   2 years ago    Primary Care at [facility], Kandee Keen, MD   2 years ago Encounter to establish care   Primary Care at [facility], Kandee Keen, MD       Future Appointments             In 3 weeks Georganna Skeans, MD Primary Care at [facility]            Refused Prescriptions Disp Refills   famotidine (PEPCID) 20 MG tablet [Pharmacy Med Name: FAMOTIDINE 20MG  TABLETS] 90 tablet     Sig: TAKE 1 TABLET(20 MG) BY MOUTH DAILY     Gastroenterology:  H2 Antagonists Failed - 05/13/2022  3:23 PM      Failed - Valid encounter within last 12 months    Recent Outpatient Visits           1 year ago Uncontrolled hypertension   Primary Care at [facility], MD   2 years ago    Primary Care at [facility], MD   2 years ago Encounter to establish care   Primary Care at [facility], MD       Future Appointments             In 3 weeks Kandee Keen, MD Primary Care at [facility]             hydrochlorothiazide (HYDRODIURIL) 25 MG tablet [Pharmacy Med Name: HYDROCHLOROTHIAZIDE 25MG TABLETS] 90 tablet     Sig: TAKE 1 TABLET(25 MG) BY MOUTH DAILY     Cardiovascular: Diuretics - Thiazide Failed - 05/13/2022  3:23 PM      Failed - Cr in normal range and within 180 days    Creat  Date Value Ref Range Status  09/11/2010 0.66 0.40 - 1.20 mg/dL Final   Creatinine, Ser  Date Value Ref Range Status  02/12/2021 0.74 0.57 - 1.00 mg/dL Final         Failed - K in normal range and within 180 days    Potassium  Date Value Ref Range Status  02/12/2021 3.6 3.5 - 5.2 mmol/L Final         Failed - Na in normal range and  within 180 days    Sodium  Date Value Ref Range Status  02/12/2021 141 134 - 144 mmol/L Final         Failed - Last BP in normal range    BP Readings from Last 1 Encounters:  05/23/21 (!) 155/99         Failed - Valid encounter within last 6 months    Recent Outpatient Visits           1 year ago Uncontrolled hypertension   Primary Care at [facility], MD   2 years ago    Primary Care at [facility], Bayard Beaver, MD   2 years ago Encounter to establish care   Primary Care at [facility], Bayard Beaver, MD       Future Appointments             In 3 weeks Dorna Mai, MD Primary Care at [facility]

## 2022-05-13 NOTE — Telephone Encounter (Signed)
Pt called and schedule the next morning appt opening for 2.9.24/ pt is out of medication and asked does she go without taking until appt or can courtesy refill be called into the pharmacy / please advise  Med Name: HYDROCHLOROTHIAZIDE 25MG  TABLETS and  famotidine (PEPCID) 20 MG tablet   [facility] [place], [place] AT [facility] [address], [place] [id number]

## 2022-05-13 NOTE — Telephone Encounter (Signed)
Patient request courtesy supply of 30 days until OV.

## 2022-05-13 NOTE — Addendum Note (Signed)
Addended by: Durwin Nora on: 05/13/2022 03:23 PM   Modules accepted: Orders

## 2022-05-25 ENCOUNTER — Telehealth: Payer: Self-pay

## 2022-05-25 NOTE — Patient Outreach (Signed)
  Care Coordination   05/25/2022 Name: TENAYA HILYER MRN: [id number] DOB: [birth year]   Care Coordination Outreach Attempts:  A second unsuccessful outreach was attempted today to offer the patient with information about available care coordination services as a benefit of their health plan.     Follow Up Plan:  Additional outreach attempts will be made to offer the patient care coordination information and services.   Encounter Outcome:  No Answer   Care Coordination Interventions:  No, not indicated    Jone Baseman, RN, MSN [place] [facility] Care Management Coordinator Direct Line [phone]

## 2022-06-05 ENCOUNTER — Ambulatory Visit: Payer: Medicare Other | Admitting: Family Medicine

## 2022-07-01 ENCOUNTER — Encounter: Payer: Self-pay | Admitting: Family Medicine

## 2022-07-01 ENCOUNTER — Ambulatory Visit (INDEPENDENT_AMBULATORY_CARE_PROVIDER_SITE_OTHER): Payer: Medicare Other | Admitting: Family Medicine

## 2022-07-01 VITALS — BP 137/88 | HR 97 | Temp 98.1°F | Resp 16 | Ht 62.75 in | Wt 142.6 lb

## 2022-07-01 DIAGNOSIS — I10 Essential (primary) hypertension: Secondary | ICD-10-CM

## 2022-07-01 DIAGNOSIS — Z13820 Encounter for screening for osteoporosis: Secondary | ICD-10-CM

## 2022-07-01 DIAGNOSIS — Z780 Asymptomatic menopausal state: Secondary | ICD-10-CM | POA: Diagnosis not present

## 2022-07-01 DIAGNOSIS — Z122 Encounter for screening for malignant neoplasm of respiratory organs: Secondary | ICD-10-CM

## 2022-07-01 DIAGNOSIS — Z0000 Encounter for general adult medical examination without abnormal findings: Secondary | ICD-10-CM | POA: Diagnosis not present

## 2022-07-01 DIAGNOSIS — K219 Gastro-esophageal reflux disease without esophagitis: Secondary | ICD-10-CM | POA: Diagnosis not present

## 2022-07-01 DIAGNOSIS — Z1231 Encounter for screening mammogram for malignant neoplasm of breast: Secondary | ICD-10-CM

## 2022-07-01 DIAGNOSIS — Z1211 Encounter for screening for malignant neoplasm of colon: Secondary | ICD-10-CM | POA: Diagnosis not present

## 2022-07-01 MED ORDER — HYDROCHLOROTHIAZIDE 25 MG PO TABS: ORAL_TABLET | ORAL | 1 refills | Status: DC

## 2022-07-01 MED ORDER — FAMOTIDINE 20 MG PO TABS: ORAL_TABLET | ORAL | 1 refills | Status: DC

## 2022-07-01 NOTE — Progress Notes (Unsigned)
Subjective:   LUCYNDA KIL is a 72 y.o. female who presents for Medicare Annual (Subsequent) preventive examination. Marland Kitchenawv  Review of Systems   Refer to Pcp       Objective:    Today's Vitals   07/01/22 1355  BP: 137/88  Pulse: 97  Resp: 16  Temp: 98.1 F (36.7 C)  TempSrc: Oral  SpO2: 98%  Weight: 142 lb 9.6 oz (64.7 kg)  Height: 5' 2.75" (1.594 m)   Body mass index is 25.46 kg/m.     07/01/2022    1:47 PM 10/31/2017   11:00 AM 06/08/2017    5:18 AM 02/23/2017    3:38 PM 02/23/2017    2:43 PM  Advanced Directives  Does Patient Have a Medical Advance Directive? No No Yes No   Type of Scientist, physiological of [place];Living will    Copy of [place] in Chart?   No - copy requested    Would patient like information on creating a medical advance directive? No - Patient declined  No - Patient declined No - Patient declined      Information is confidential and restricted. Go to Review Flowsheets to unlock data.    Current Medications (verified) Outpatient Encounter Medications as of 07/01/2022  Medication Sig   acetaminophen (TYLENOL) 500 MG tablet Take by mouth.   aspirin 81 MG chewable tablet Chew 81 mg by mouth daily.    benzonatate (TESSALON) 100 MG capsule Take 1 capsule (100 mg total) by mouth every 8 (eight) hours as needed for cough.   benztropine (COGENTIN) 1 MG tablet Take by mouth.   cephALEXin (KEFLEX) 500 MG capsule Take by mouth.   famotidine (PEPCID) 20 MG tablet TAKE 1 TABLET(20 MG) BY MOUTH DAILY   hydrochlorothiazide (HYDRODIURIL) 25 MG tablet TAKE 1 TABLET(25 MG) BY MOUTH DAILY   risperiDONE (RISPERDAL) 1 MG tablet Take by mouth.   fluticasone (FLONASE) 50 MCG/ACT nasal spray Place 1 spray into both nostrils daily for 3 days.   No facility-administered encounter medications on file as of 07/01/2022.    Allergies (verified) Patient has no known allergies.   History: Past Medical History:  Diagnosis Date    Anemia    iron deficiency   Bipolar 1 disorder ([place])    Diverticulosis 2008   sigmoid diverticular bleed in June 2008 s/p colonoscopy   HTN (hypertension)    Tobacco abuse    Past Surgical History:  Procedure Laterality Date   TOTAL ABDOMINAL HYSTERECTOMY  1984   No family history on file. 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, 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, alternates between day and night shift   Single  No children   Lives alone   Smokes a pack per week for last 30 years, does not want to quit   Denies alcohol or illicit drug use            Social Determinants of Health   Financial Resource Strain: High Risk (07/01/2022)   Overall Financial Resource Strain (CARDIA)    Difficulty of Paying Living Expenses: Very hard  Food Insecurity: Food Insecurity Present (07/01/2022)   Hunger Vital Sign    Worried About Running Out of Food in the Last Year: Never true    [place] in the Last Year: Often true  Transportation Needs: Unmet Transportation Needs (07/01/2022)   PRAPARE - Hydrologist (Medical): Yes    Lack of Transportation (Non-Medical): Yes  Physical Activity: Sufficiently Active (07/01/2022)   Exercise Vital Sign    Days of Exercise per Week: 7 days    Minutes of Exercise per Session: 50 min  Stress: No Stress Concern  Present (07/01/2022)   [place]    Feeling of Stress : Not at all  Social Connections: Moderately Integrated (07/01/2022)   Social Connection and Isolation Panel [NHANES]    Frequency of Communication with Friends and Family: Twice a week    Frequency of Social Gatherings with Friends and Family: Twice a week    Attends Religious Services: 1 to 4 times per year    Active Member of [employer] or Organizations: Not on file    Attends Archivist Meetings: 1 to 4 times per year    Marital Status: Never married    Tobacco Counseling Ready to quit: Not Answered Counseling given: Not Answered Tobacco comments: 1pack/week   Clinical Intake:           Diabetes: No     Diabetic?  Interpreter Needed?: No      Activities of Daily Living    07/01/2022    1:48 PM  In your present state of health, do you have any difficulty performing the following activities:  Hearing? 0  Vision? 0  Difficulty concentrating or making decisions? 0  Walking or climbing stairs? 0  Dressing or bathing? 0  Doing errands, shopping? 0  Preparing Food and eating ? N  Using the Toilet? N  In the past six months, have you accidently leaked urine? N  Do you have problems with loss of bowel control? N  Managing your Medications? N  Managing your Finances? N  Housekeeping or managing your Housekeeping? N    Patient Care Team: Dorna Mai, MD as PCP - General (Family Medicine)  Indicate any recent Medical Services you may have received from other than [facility] providers in the past year (date may be approximate).     Assessment:   This is a routine wellness examination for [place].  Hearing/Vision screen No results found.  Dietary issues and exercise activities discussed:     Goals Addressed   None   Depression Screen    02/12/2021    1:47 PM 11/29/2019   11:40 AM 10/12/2011    9:51 AM 08/26/2011   11:00 AM 09/11/2010     3:38 PM  PHQ 2/9 Scores  PHQ - 2 Score 1 2 0 0 0  PHQ- 9 Score 7 14       Fall Risk    07/01/2022    1:47 PM 02/12/2021    1:37 PM 11/28/2019    2:44  PM  Fall Risk   Falls in the past year? 1 0 0  Number falls in past yr: 0 0 0  Injury with Fall? 0 0 0    FALL RISK PREVENTION PERTAINING TO THE HOME:  Any stairs in or around the home? Yes  If so, are there any without handrails? Yes  Home free of loose throw rugs in walkways, pet beds, electrical cords, etc? Yes  Adequate lighting in your home to reduce risk of falls? Yes   ASSISTIVE DEVICES UTILIZED TO PREVENT FALLS:  Life alert? No  Use of a cane, walker or w/c? No  Grab bars in the bathroom? No  Shower chair or bench in shower? No  Elevated toilet seat or a handicapped toilet? No   TIMED UP AND GO:  Was the test performed? No .  Length of time to ambulate 10 feet:  sec.   Gait slow and steady without use of assistive device  Cognitive Function:    07/01/2022    1:48 PM  MMSE - Mini Mental State Exam  Orientation to time 5  Orientation to Place 2  Registration 3  Attention/ Calculation 5  Recall 3  Language- name 2 objects 2  Language- repeat 1  Language- follow 3 step command 3  Language- read & follow direction 1        07/01/2022    1:48 PM  6CIT Screen  What Year? 4 points  What month? 3 points  What time? 3 points  Count back from 20 4 points  Months in reverse 4 points  Repeat phrase 10 points  Total Score 28 points    Immunizations Immunization History  Administered Date(s) Administered   Influenza Split 02/28/2013   Influenza Whole 02/03/2007, 02/28/2008, 07/22/2009   Influenza, High Dose Seasonal PF 12/27/2016   Influenza,inj,Quad PF,6+ Mos 01/19/2020, 02/12/2021   Influenza,inj,Quad PF,6-35 Mos 05/12/2022   PFIZER(Purple Top)SARS-COV-2 Vaccination 07/13/2019, 08/03/2019   Pneumococcal Conjugate-13 11/28/2019   Tdap 11/28/2019    TDAP status: Up to date  Flu Vaccine status: Up to  date  Pneumococcal vaccine status: Declined,  Education has been provided regarding the importance of this vaccine but patient still declined. Advised may receive this vaccine at local pharmacy or Health Dept. Aware to provide a copy of the vaccination record if obtained from local pharmacy or Health Dept. Verbalized acceptance and understanding.   Covid-19 vaccine status: Completed vaccines  Qualifies for Shingles Vaccine? No   Zostavax completed No   Shingrix Completed?: No.    Education has been provided regarding the importance of this vaccine. Patient has been advised to call insurance company to determine out of pocket expense if they have not yet received this vaccine. Advised may also receive vaccine at local pharmacy or Health Dept. Verbalized acceptance and understanding.  Screening Tests Health Maintenance  Topic Date Due   MAMMOGRAM  04/02/2022   COVID-19 Vaccine (3 - 2023-24 season) 07/17/2022 (Originally 12/26/2021)   COLONOSCOPY (Pts 45-78yr Insurance coverage will need to be confirmed)  12/21/2022 (Originally 10/17/2016)   Zoster Vaccines- Shingrix (1 of 2) 03/28/2023 (Originally 05/22/2001)   Pneumonia Vaccine 71 Years old (2 of 2 - PPSV23 or PCV20) 04/27/2023 (Originally 01/23/2020)   Medicare Annual Wellness (AWV)  07/01/2023   DTaP/Tdap/Td (2 - Td or Tdap) 11/27/2029   INFLUENZA VACCINE  Completed   DEXA SCAN  Completed   Hepatitis C Screening  Completed   HPV VACCINES  Aged Out    Health Maintenance  Health  Maintenance Due  Topic Date Due   MAMMOGRAM  04/02/2022    Colorectal cancer screening: Referral to GI placed 07/01/2022. Pt aware the office will call re: appt.  Mammogram status: Ordered 07/01/2022. Pt provided with contact info and advised to call to schedule appt.   Bone Density status: Ordered 07/01/2022. Pt provided with contact info and advised to call to schedule appt.  Lung Cancer Screening: (Low Dose CT Chest recommended if Age 87-80 years, 30  pack-year currently smoking OR have quit w/in 15years.) does qualify.   Lung Cancer Screening Referral: 07/01/2022  Additional Screening:  Hepatitis C Screening: does not qualify; Completed 11/28/2019  Vision Screening: Recommended annual ophthalmology exams for early detection of glaucoma and other disorders of the eye. Is the patient up to date with their annual eye exam?  No  Who is the provider or what is the name of the office in which the patient attends annual eye exams? N/a If pt is not established with a provider, would they like to be referred to a provider to establish care? No .   Dental Screening: Recommended annual dental exams for proper oral hygiene  Community Resource Referral / Chronic Care Management: CRR required this visit?  No   CCM required this visit?  No      Plan:     I have personally reviewed and noted the following in the patient's chart:   Medical and social history Use of alcohol, tobacco or illicit drugs  Current medications and supplements including opioid prescriptions. Patient is not currently taking opioid prescriptions. Functional ability and status Nutritional status Physical activity Advanced directives List of other physicians Hospitalizations, surgeries, and ER visits in previous 12 months Vitals Screenings to include cognitive, depression, and falls Referrals and appointments  In addition, I have reviewed and discussed with patient certain preventive protocols, quality metrics, and best practice recommendations. A written personalized care plan for preventive services as well as general preventive health recommendations were provided to patient.     Melene Plan, RMA   07/01/2022   Nurse Notes:

## 2022-07-02 ENCOUNTER — Encounter: Payer: Self-pay | Admitting: Family Medicine

## 2022-07-02 NOTE — Progress Notes (Signed)
Established Patient Office Visit  Subjective    Patient ID: Pamela Hardin, female    DOB: Jan 03, 1951  Age: 72 y.o. MRN: [id number]  CC:  Chief Complaint  Patient presents with   Medicare Wellness    HPI Pamela Hardin presents for annual wellness visit and for follow up of chronic med issues. Patient denies acute complaints.    Outpatient Encounter Medications as of 07/01/2022  Medication Sig   acetaminophen (TYLENOL) 500 MG tablet Take by mouth.   aspirin 81 MG chewable tablet Chew 81 mg by mouth daily.    benzonatate (TESSALON) 100 MG capsule Take 1 capsule (100 mg total) by mouth every 8 (eight) hours as needed for cough.   benztropine (COGENTIN) 1 MG tablet Take by mouth.   cephALEXin (KEFLEX) 500 MG capsule Take by mouth.   risperiDONE (RISPERDAL) 1 MG tablet Take by mouth.   [DISCONTINUED] famotidine (PEPCID) 20 MG tablet TAKE 1 TABLET(20 MG) BY MOUTH DAILY   [DISCONTINUED] hydrochlorothiazide (HYDRODIURIL) 25 MG tablet TAKE 1 TABLET(25 MG) BY MOUTH DAILY   famotidine (PEPCID) 20 MG tablet TAKE 1 TABLET(20 MG) BY MOUTH DAILY   fluticasone (FLONASE) 50 MCG/ACT nasal spray Place 1 spray into both nostrils daily for 3 days.   hydrochlorothiazide (HYDRODIURIL) 25 MG tablet TAKE 1 TABLET(25 MG) BY MOUTH DAILY   No facility-administered encounter medications on file as of 07/01/2022.    Past Medical History:  Diagnosis Date   Anemia    iron deficiency   Bipolar 1 disorder ([place])    Diverticulosis 2008   sigmoid diverticular bleed in June 2008 s/p colonoscopy   HTN (hypertension)    Tobacco abuse     Past Surgical History:  Procedure Laterality Date   TOTAL ABDOMINAL HYSTERECTOMY  1984    No family history on file.  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, 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, alternates between day and night shift   Single   No children   Lives alone   Smokes a pack per week for last 30 years, does not want to quit   Denies alcohol or illicit drug use            Social Determinants of Radio broadcast assistant Strain: High Risk (07/01/2022)   Overall Financial Resource Strain (CARDIA)    Difficulty of Paying Living Expenses: Very hard  Food Insecurity: Food Insecurity Present (07/01/2022)   Hunger Vital Sign    Worried About Running Out of Food in the Last Year: Never true    [place] in the Last Year: Often true  Transportation Needs: Unmet Transportation Needs (07/01/2022)   PRAPARE - Transportation    Lack  of Transportation (Medical): Yes    Lack of Transportation (Non-Medical): Yes  Physical Activity: Sufficiently Active (07/01/2022)   Exercise Vital Sign    Days of Exercise per Week: 7 days    Minutes of Exercise per Session: 50 min  Stress: No Stress Concern Present (07/01/2022)   [place]    Feeling of Stress : Not at all  Social Connections: Moderately Integrated (07/01/2022)   Social Connection and Isolation Panel [NHANES]    Frequency of Communication with Friends and Family: Twice a week    Frequency of Social Gatherings with Friends and Family: Twice a week    Attends Religious Services: 1 to 4 times per year    Active Member of [employer] or Organizations: Not on file    Attends Archivist Meetings: 1 to 4 times per  year    Marital Status: Never married  Intimate Partner Violence: Not At Risk (07/01/2022)   Humiliation, Afraid, Rape, and Kick questionnaire    Fear of Current or Ex-Partner: No    Emotionally Abused: No    Physically Abused: No    Sexually Abused: No    Review of Systems  All other systems reviewed and are negative.       Objective    BP 137/88   Pulse 97   Temp 98.1 F (36.7 C) (Oral)   Resp 16   Ht 5' 2.75" (1.594 m)   Wt 142 lb 9.6 oz (64.7 kg)   SpO2 98%   BMI 25.46 kg/m   Physical Exam Vitals and nursing note reviewed.  Constitutional:      General: She is not in acute distress. Cardiovascular:     Rate and Rhythm: Normal rate and regular rhythm.  Pulmonary:     Effort: Pulmonary effort is normal.     Breath sounds: Normal breath sounds.  Abdominal:     Palpations: Abdomen is soft.     Tenderness: There is no abdominal tenderness.  Musculoskeletal:     Right lower leg: No edema.     Left lower leg: No edema.  Neurological:     General: No focal deficit present.     Mental Status: She is alert and oriented to person, place, and time.         Assessment & Plan:   1. Encounter for Medicare annual wellness exam   2. Essential hypertension Appears stable. Continue   3. Gastroesophageal reflux disease without esophagitis Continue   4. Encounter for screening mammogram for malignant neoplasm of breast  - MM Digital Screening; Future  5. Encounter for osteoporosis screening in asymptomatic postmenopausal patient  - DG Bone Density; Future  6. Screening for lung cancer  - Ambulatory Referral Lung Cancer Screening [facility]  7. Colon cancer screening  - Ambulatory referral to Gastroenterology    Return in 6 months (on 01/01/2023) for follow up.   Pamela Sax, MD

## 2022-12-25 ENCOUNTER — Other Ambulatory Visit: Payer: Self-pay | Admitting: Family Medicine

## 2023-01-01 ENCOUNTER — Ambulatory Visit (INDEPENDENT_AMBULATORY_CARE_PROVIDER_SITE_OTHER): Payer: Medicare Other | Admitting: Family Medicine

## 2023-01-01 ENCOUNTER — Encounter: Payer: Self-pay | Admitting: Family Medicine

## 2023-01-01 VITALS — BP 105/70 | HR 112 | Temp 98.4°F | Resp 16 | Ht 63.0 in | Wt 134.0 lb

## 2023-01-01 DIAGNOSIS — K219 Gastro-esophageal reflux disease without esophagitis: Secondary | ICD-10-CM | POA: Diagnosis not present

## 2023-01-01 DIAGNOSIS — I10 Essential (primary) hypertension: Secondary | ICD-10-CM

## 2023-01-01 DIAGNOSIS — J310 Chronic rhinitis: Secondary | ICD-10-CM

## 2023-01-01 DIAGNOSIS — Z23 Encounter for immunization: Secondary | ICD-10-CM

## 2023-01-01 DIAGNOSIS — F17210 Nicotine dependence, cigarettes, uncomplicated: Secondary | ICD-10-CM | POA: Diagnosis not present

## 2023-01-01 MED ORDER — CETIRIZINE HCL 10 MG PO TABS: 10.0000 mg | ORAL_TABLET | Freq: Every day | ORAL | 1 refills | Status: AC

## 2023-01-01 MED ORDER — HYDROCHLOROTHIAZIDE 25 MG PO TABS: ORAL_TABLET | ORAL | 1 refills | Status: DC

## 2023-01-01 MED ORDER — FAMOTIDINE 20 MG PO TABS: ORAL_TABLET | ORAL | 1 refills | Status: DC

## 2023-01-01 NOTE — Progress Notes (Unsigned)
Medication refill, flu shot, sore throat

## 2023-01-01 NOTE — Progress Notes (Unsigned)
Established Patient Office Visit  Subjective    Patient ID: Pamela Hardin, female    DOB: 1950-10-10  Age: 72 y.o. MRN: [id number]  CC:  Chief Complaint  Patient presents with   Follow-up    Medication refill, flu shot, sore throat    HPI Pamela Hardin presents for follow up of chronic med issues.   Outpatient Encounter Medications as of 01/01/2023  Medication Sig   acetaminophen (TYLENOL) 500 MG tablet Take by mouth.   aspirin 81 MG chewable tablet Chew 81 mg by mouth daily.    benzonatate (TESSALON) 100 MG capsule Take 1 capsule (100 mg total) by mouth every 8 (eight) hours as needed for cough.   benztropine (COGENTIN) 1 MG tablet Take by mouth.   cephALEXin (KEFLEX) 500 MG capsule Take by mouth.   cetirizine (ZYRTEC) 10 MG tablet Take 1 tablet (10 mg total) by mouth daily.   risperiDONE (RISPERDAL) 1 MG tablet Take by mouth.   [DISCONTINUED] famotidine (PEPCID) 20 MG tablet TAKE 1 TABLET(20 MG) BY MOUTH DAILY   [DISCONTINUED] hydrochlorothiazide (HYDRODIURIL) 25 MG tablet TAKE 1 TABLET(25 MG) BY MOUTH DAILY   famotidine (PEPCID) 20 MG tablet TAKE 1 TABLET(20 MG) BY MOUTH DAILY   fluticasone (FLONASE) 50 MCG/ACT nasal spray Place 1 spray into both nostrils daily for 3 days.   hydrochlorothiazide (HYDRODIURIL) 25 MG tablet TAKE 1 TABLET(25 MG) BY MOUTH DAILY   No facility-administered encounter medications on file as of 01/01/2023.    Past Medical History:  Diagnosis Date   Anemia    iron deficiency   Bipolar 1 disorder ([facility])    Diverticulosis 2008   sigmoid diverticular bleed in June 2008 s/p colonoscopy   HTN (hypertension)    Tobacco abuse     Past Surgical History:  Procedure Laterality Date   TOTAL ABDOMINAL HYSTERECTOMY  1984    History reviewed. No pertinent family history.  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, 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, alternates between day and night shift   Single   No children   Lives alone   Smokes a pack per week for last 30 years, does not want to quit   Denies alcohol or illicit drug use            Social Determinants of Health   Financial Resource Strain: Medium Risk (01/01/2023)   Overall Financial Resource Strain (CARDIA)    Difficulty of Paying Living Expenses: Somewhat hard  Food Insecurity: Food Insecurity Present (01/01/2023)   Hunger Vital Sign    Worried About Running Out of Food in the Last Year: Often true    Ran Out of Food in the Last Year: Often true  Transportation Needs: No Transportation Needs (01/01/2023)   PRAPARE - Administrator, Civil Service (Medical): No    Lack of Transportation (Non-Medical): No  Physical Activity: Sufficiently Active (01/01/2023)   Exercise Vital Sign    Days of Exercise per Week: 7 days    Minutes of Exercise per Session: 30 min  Stress: No Stress Concern Present (01/01/2023)   [employer] of Occupational Health - Occupational Stress Questionnaire    Feeling of Stress : Not at all  Social Connections: Socially Isolated (01/01/2023)   Social Connection and Isolation Panel [NHANES]    Frequency of Communication with Friends and Family: Never    Frequency of Social Gatherings with Friends and Family: Once a week    Attends Religious Services: More than 4 times per year    Active Member of [employer] or Organizations: No     Attends Banker Meetings: Never    Marital Status: Never married  Intimate Partner Violence: Not At Risk (01/01/2023)   Humiliation, Afraid, Rape, and Kick questionnaire    Fear of Current or Ex-Partner: No    Emotionally Abused: No    Physically Abused: No    Sexually Abused: No    Review of Systems  HENT:  Positive for congestion.   All other systems reviewed and are negative.       Objective    BP 105/70 (BP Location: Left Arm, Patient Position: Sitting, Cuff Size: Normal)   Pulse (!) 112   Temp 98.4 F (36.9 C) (Oral)   Resp 16   Ht 5\' 3"  (1.6 m)   Wt 134 lb (60.8 kg)   SpO2 97%   BMI 23.74 kg/m   Physical Exam Vitals and nursing note reviewed.  Constitutional:      General: She is not in acute distress. Cardiovascular:     Rate and Rhythm: Normal rate and regular rhythm.  Pulmonary:     Effort: Pulmonary effort is normal.     Breath sounds: Normal breath sounds.  Abdominal:     Palpations: Abdomen is soft.     Tenderness: There is no abdominal tenderness.  Neurological:     General: No focal deficit present.     Mental Status: She is alert and oriented to person, place, and time.         Assessment & Plan:   Essential hypertension  Rhinitis, unspecified type  Gastroesophageal reflux disease without esophagitis  Encounter for immunization -     Flu vaccine trivalent PF, 6mos and older(Flulaval,Afluria,Fluarix,Fluzone)  Other orders -     hydroCHLOROthiazide; TAKE 1 TABLET(25 MG) BY MOUTH DAILY  Dispense: 90 tablet; Refill: 1 -     Famotidine; TAKE 1 TABLET(20 MG) BY MOUTH DAILY  Dispense: 90 tablet; Refill: 1 -     Cetirizine HCl; Take 1 tablet (10 mg total) by mouth daily.  Dispense: 90 tablet; Refill: 1     Return in about 6 months (around 07/01/2023) for follow up.   Tommie Raymond, MD

## 2023-01-04 ENCOUNTER — Encounter: Payer: Self-pay | Admitting: Family Medicine

## 2023-04-06 ENCOUNTER — Telehealth: Payer: Self-pay | Admitting: Family Medicine

## 2023-04-06 NOTE — Telephone Encounter (Signed)
Patient needs refill for Famotidine, hydrochlorothiazide sent to [facility] on [address] .

## 2023-04-07 ENCOUNTER — Other Ambulatory Visit: Payer: Self-pay | Admitting: Family Medicine

## 2023-04-07 MED ORDER — HYDROCHLOROTHIAZIDE 25 MG PO TABS: ORAL_TABLET | ORAL | 1 refills | Status: DC

## 2023-04-07 MED ORDER — FAMOTIDINE 20 MG PO TABS: ORAL_TABLET | ORAL | 1 refills | Status: DC

## 2023-07-01 ENCOUNTER — Ambulatory Visit (INDEPENDENT_AMBULATORY_CARE_PROVIDER_SITE_OTHER): Payer: Medicare Other | Admitting: Family Medicine

## 2023-07-01 ENCOUNTER — Encounter: Payer: Self-pay | Admitting: Family Medicine

## 2023-07-01 VITALS — BP 135/81 | HR 89 | Temp 97.9°F | Resp 16 | Ht 62.5 in | Wt 139.4 lb

## 2023-07-01 DIAGNOSIS — Z23 Encounter for immunization: Secondary | ICD-10-CM | POA: Diagnosis not present

## 2023-07-01 DIAGNOSIS — K219 Gastro-esophageal reflux disease without esophagitis: Secondary | ICD-10-CM

## 2023-07-01 DIAGNOSIS — I10 Essential (primary) hypertension: Secondary | ICD-10-CM

## 2023-07-01 MED ORDER — FAMOTIDINE 20 MG PO TABS: ORAL_TABLET | ORAL | 1 refills | Status: DC

## 2023-07-01 MED ORDER — HYDROCHLOROTHIAZIDE 25 MG PO TABS: ORAL_TABLET | ORAL | 1 refills | Status: DC

## 2023-07-01 NOTE — Progress Notes (Signed)
 Established Patient Office Visit  Subjective    Patient ID: Pamela Hardin, female    DOB: 1950/06/07  Age: 73 y.o. MRN: [id number]  CC:  Chief Complaint  Patient presents with   Follow-up    6 month    HPI Pamela Hardin presents for routine follow up of chronic med issues including hypertension and gerd. Patient reports med compliance and denies acute complaints.   Outpatient Encounter Medications as of 07/01/2023  Medication Sig   acetaminophen (TYLENOL) 500 MG tablet Take by mouth.   aspirin 81 MG chewable tablet Chew 81 mg by mouth daily.    benzonatate (TESSALON) 100 MG capsule Take 1 capsule (100 mg total) by mouth every 8 (eight) hours as needed for cough.   benztropine (COGENTIN) 1 MG tablet Take by mouth.   cephALEXin (KEFLEX) 500 MG capsule Take by mouth.   cetirizine (ZYRTEC) 10 MG tablet Take 1 tablet (10 mg total) by mouth daily.   risperiDONE (RISPERDAL) 1 MG tablet Take by mouth.   [DISCONTINUED] famotidine (PEPCID) 20 MG tablet TAKE 1 TABLET(20 MG) BY MOUTH DAILY   [DISCONTINUED] hydrochlorothiazide (HYDRODIURIL) 25 MG tablet TAKE 1 TABLET(25 MG) BY MOUTH DAILY   famotidine (PEPCID) 20 MG tablet TAKE 1 TABLET(20 MG) BY MOUTH DAILY   fluticasone (FLONASE) 50 MCG/ACT nasal spray Place 1 spray into both nostrils daily for 3 days.   hydrochlorothiazide (HYDRODIURIL) 25 MG tablet TAKE 1 TABLET(25 MG) BY MOUTH DAILY   No facility-administered encounter medications on file as of 07/01/2023.    Past Medical History:  Diagnosis Date   Anemia    iron deficiency   Bipolar 1 disorder ([facility])    Diverticulosis 2008   sigmoid diverticular bleed in June 2008 s/p colonoscopy   HTN (hypertension)    Tobacco abuse     Past Surgical History:  Procedure Laterality Date   TOTAL ABDOMINAL HYSTERECTOMY  1984    History reviewed. No pertinent family history.  Social History   Socioeconomic History   Marital status: Single    Spouse name: Not on file   Number of  children: Not on file   Years of education: Not on file   Highest education level: Not on file  Occupational History   Not on file  Tobacco Use   Smoking status: Every Day    Types: Cigarettes   Smokeless tobacco: Never   Tobacco comments:    1pack/week  Vaping Use   Vaping status: Never Used  Substance and Sexual Activity   Alcohol use: Yes    Comment: rum   Drug use: Yes    Types: Marijuana    Comment: uses frequently   Sexual activity: Not Currently  Other Topics Concern   Not on file  Social History Narrative   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, 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, alternates between day and night shift   Single   No children   Lives alone   Smokes a pack per week for last 30 years, does not want to quit   Denies alcohol or illicit drug use            Social Drivers of Corporate investment banker Strain: Medium Risk (01/01/2023)   Overall Financial Resource Strain (CARDIA)    Difficulty of Paying Living Expenses: Somewhat hard  Food Insecurity: Food Insecurity Present (01/01/2023)   Hunger Vital Sign    Worried About Running Out of Food in the  Last Year: Often true    Ran Out of Food in the Last Year: Often true  Transportation Needs: No Transportation Needs (01/01/2023)   PRAPARE - Administrator, Civil Service (Medical): No    Lack of Transportation (Non-Medical): No  Physical Activity: Sufficiently Active (01/01/2023)   Exercise Vital Sign    Days of Exercise per Week: 7 days    Minutes of Exercise per Session: 30 min  Stress: No Stress Concern Present (01/01/2023)   [employer] of Occupational Health - Occupational Stress Questionnaire    Feeling of Stress : Not at all  Social Connections: Socially Isolated (01/01/2023)   Social Connection and Isolation Panel [NHANES]    Frequency of Communication with Friends and Family: Never    Frequency of Social Gatherings with Friends and Family: Once a week    Attends  Religious Services: More than 4 times per year    Active Member of [employer] or Organizations: No    Attends Banker Meetings: Never    Marital Status: Never married  Intimate Partner Violence: Not At Risk (01/01/2023)   Humiliation, Afraid, Rape, and Kick questionnaire    Fear of Current or Ex-Partner: No    Emotionally Abused: No    Physically Abused: No    Sexually Abused: No    Review of Systems  All other systems reviewed and are negative.       Objective    BP 135/81   Pulse 89   Temp 97.9 F (36.6 C) (Oral)   Resp 16   Ht 5' 2.5" (1.588 m)   Wt 139 lb 6.4 oz (63.2 kg)   SpO2 98%   BMI 25.09 kg/m   Physical Exam Vitals and nursing note reviewed.  Constitutional:      General: She is not in acute distress. Cardiovascular:     Rate and Rhythm: Normal rate and regular rhythm.  Pulmonary:     Effort: Pulmonary effort is normal.     Breath sounds: Normal breath sounds.  Abdominal:     Palpations: Abdomen is soft.     Tenderness: There is no abdominal tenderness.  Neurological:     General: No focal deficit present.     Mental Status: She is alert and oriented to person, place, and time.         Assessment & Plan:   Essential hypertension  Gastroesophageal reflux disease without esophagitis  Encounter for immunization -     Flu Vaccine Trivalent High Dose (Fluad)  Other orders -     hydroCHLOROthiazide; TAKE 1 TABLET(25 MG) BY MOUTH DAILY  Dispense: 90 tablet; Refill: 1 -     Famotidine; TAKE 1 TABLET(20 MG) BY MOUTH DAILY  Dispense: 90 tablet; Refill: 1     Return in about 6 months (around 01/01/2024).   Tommie Raymond, MD

## 2023-07-02 ENCOUNTER — Encounter: Payer: Self-pay | Admitting: Family Medicine

## 2023-12-29 ENCOUNTER — Ambulatory Visit: Admitting: Family Medicine

## 2024-02-04 ENCOUNTER — Telehealth: Payer: Self-pay | Admitting: Family Medicine

## 2024-02-04 NOTE — Telephone Encounter (Signed)
 Pt requesting refill for Hydrochlorothiazide  and Famotidine . Please advise if pt needs a visit. Pt missed an OV in Sept. I scheduled an appt for next available 12/12. Pt want to know if she can get medication refill to hold her until appt

## 2024-02-07 ENCOUNTER — Other Ambulatory Visit: Payer: Self-pay | Admitting: Family Medicine

## 2024-02-07 MED ORDER — FAMOTIDINE 20 MG PO TABS: ORAL_TABLET | ORAL | 0 refills | Status: DC

## 2024-02-07 MED ORDER — HYDROCHLOROTHIAZIDE 25 MG PO TABS: ORAL_TABLET | ORAL | 0 refills | Status: DC

## 2024-02-08 NOTE — Progress Notes (Signed)
 ARIANNA HAYDON                                          MRN: [id number]   02/08/2024   The [facility] Quality Team Specialist reviewed this patient medical record for the purposes of chart review for care gap closure. The following were reviewed: chart review for care gap closure-breast cancer screening.    [facility] Quality Team

## 2024-04-07 ENCOUNTER — Telehealth: Payer: Self-pay | Admitting: Family Medicine

## 2024-04-07 ENCOUNTER — Ambulatory Visit: Admitting: Family Medicine

## 2024-04-07 NOTE — Telephone Encounter (Signed)
 Called pt and left vm to call office back to reschedule missed appt

## 2024-05-11 ENCOUNTER — Telehealth: Payer: Self-pay | Admitting: Family Medicine

## 2024-05-11 ENCOUNTER — Other Ambulatory Visit: Payer: Self-pay

## 2024-05-11 MED ORDER — FAMOTIDINE 20 MG PO TABS: ORAL_TABLET | ORAL | 0 refills | Status: AC

## 2024-05-11 MED ORDER — HYDROCHLOROTHIAZIDE 25 MG PO TABS: ORAL_TABLET | ORAL | 0 refills | Status: AC

## 2024-05-11 NOTE — Telephone Encounter (Signed)
 Done. Pt came in and scheduled visit for next week with front desk

## 2024-05-11 NOTE — Telephone Encounter (Signed)
 Pt requesting refills for famotidine  and hydrochlorothiazide . Pt has an appt scheduled in march. Please advise.

## 2024-05-23 ENCOUNTER — Ambulatory Visit: Payer: Self-pay | Admitting: Family Medicine

## 2024-05-24 ENCOUNTER — Telehealth: Payer: Self-pay | Admitting: Family Medicine

## 2024-05-24 NOTE — Telephone Encounter (Signed)
 Called pt and left vm to call office back to reschedule missed appt

## 2024-07-10 ENCOUNTER — Ambulatory Visit

## 2024-07-10 ENCOUNTER — Ambulatory Visit: Payer: Self-pay | Admitting: Family Medicine

## 2024-09-29 ENCOUNTER — Ambulatory Visit: Admitting: Family Medicine
